# Patient Record
Sex: Male | Born: 1970 | Race: White | Hispanic: No | Marital: Married | Smoking: Former smoker
Health system: Southern US, Community
[De-identification: ages and names within clinical notes are randomized; demographics above are authoritative.]

## PROBLEM LIST (undated history)

## (undated) DIAGNOSIS — I10 Essential (primary) hypertension: Secondary | ICD-10-CM

## (undated) DIAGNOSIS — F419 Anxiety disorder, unspecified: Secondary | ICD-10-CM

## (undated) DIAGNOSIS — E7800 Pure hypercholesterolemia, unspecified: Secondary | ICD-10-CM

## (undated) HISTORY — PX: VASECTOMY: SHX75

## (undated) HISTORY — PX: CYSTECTOMY: SUR359

---

## 2007-04-23 ENCOUNTER — Emergency Department (HOSPITAL_COMMUNITY): Admission: EM | Admit: 2007-04-23 | Discharge: 2007-04-24 | Payer: Self-pay | Admitting: Emergency Medicine

## 2009-12-24 ENCOUNTER — Emergency Department (HOSPITAL_COMMUNITY): Admission: EM | Admit: 2009-12-24 | Discharge: 2009-12-24 | Payer: Self-pay | Admitting: Emergency Medicine

## 2011-02-08 LAB — CBC
HCT: 43 % (ref 39.0–52.0)
MCHC: 34.9 g/dL (ref 30.0–36.0)
MCV: 92.4 fL (ref 78.0–100.0)
Platelets: 338 10*3/uL (ref 150–400)
RDW: 12.6 % (ref 11.5–15.5)
WBC: 10.9 10*3/uL — ABNORMAL HIGH (ref 4.0–10.5)

## 2011-02-08 LAB — BASIC METABOLIC PANEL
BUN: 9 mg/dL (ref 6–23)
CO2: 26 mEq/L (ref 19–32)
Chloride: 105 mEq/L (ref 96–112)
Creatinine, Ser: 0.73 mg/dL (ref 0.4–1.5)
Glucose, Bld: 90 mg/dL (ref 70–99)
Potassium: 4.1 mEq/L (ref 3.5–5.1)

## 2011-02-08 LAB — DIFFERENTIAL
Basophils Relative: 1 % (ref 0–1)
Eosinophils Absolute: 0.2 10*3/uL (ref 0.0–0.7)
Eosinophils Relative: 2 % (ref 0–5)
Lymphs Abs: 2.3 10*3/uL (ref 0.7–4.0)
Neutrophils Relative %: 67 % (ref 43–77)

## 2016-12-13 DIAGNOSIS — Z23 Encounter for immunization: Secondary | ICD-10-CM | POA: Diagnosis not present

## 2016-12-13 DIAGNOSIS — H6590 Unspecified nonsuppurative otitis media, unspecified ear: Secondary | ICD-10-CM | POA: Diagnosis not present

## 2017-01-07 DIAGNOSIS — H6591 Unspecified nonsuppurative otitis media, right ear: Secondary | ICD-10-CM | POA: Diagnosis not present

## 2017-01-07 DIAGNOSIS — E785 Hyperlipidemia, unspecified: Secondary | ICD-10-CM | POA: Diagnosis not present

## 2017-01-07 DIAGNOSIS — I10 Essential (primary) hypertension: Secondary | ICD-10-CM | POA: Diagnosis not present

## 2017-01-07 DIAGNOSIS — F419 Anxiety disorder, unspecified: Secondary | ICD-10-CM | POA: Diagnosis not present

## 2017-01-09 DIAGNOSIS — I10 Essential (primary) hypertension: Secondary | ICD-10-CM | POA: Diagnosis not present

## 2017-01-09 DIAGNOSIS — E785 Hyperlipidemia, unspecified: Secondary | ICD-10-CM | POA: Diagnosis not present

## 2017-07-26 DIAGNOSIS — R1084 Generalized abdominal pain: Secondary | ICD-10-CM | POA: Insufficient documentation

## 2017-07-26 DIAGNOSIS — F17228 Nicotine dependence, chewing tobacco, with other nicotine-induced disorders: Secondary | ICD-10-CM | POA: Insufficient documentation

## 2017-07-26 DIAGNOSIS — R51 Headache: Secondary | ICD-10-CM | POA: Diagnosis present

## 2017-07-26 DIAGNOSIS — G4489 Other headache syndrome: Secondary | ICD-10-CM | POA: Insufficient documentation

## 2017-07-26 DIAGNOSIS — I10 Essential (primary) hypertension: Secondary | ICD-10-CM | POA: Diagnosis not present

## 2017-07-26 LAB — CBG MONITORING, ED: GLUCOSE-CAPILLARY: 152 mg/dL — AB (ref 65–99)

## 2017-07-26 NOTE — ED Notes (Signed)
Pt came to triage concerned that his problem may be his appendix. Triage RN notified.

## 2017-07-26 NOTE — ED Triage Notes (Signed)
Headache. Lower back pain and urinary frequency. He was seen by his MD for chills, body aches and back pain. He was given a one time antibiotic yesterday and he felt better this am.

## 2017-07-27 ENCOUNTER — Emergency Department (HOSPITAL_BASED_OUTPATIENT_CLINIC_OR_DEPARTMENT_OTHER)
Admission: EM | Admit: 2017-07-27 | Discharge: 2017-07-27 | Disposition: A | Discharge status: Home / Self Care | Payer: Commercial Managed Care - PPO | Attending: Emergency Medicine | Admitting: Emergency Medicine

## 2017-07-27 DIAGNOSIS — G4489 Other headache syndrome: Secondary | ICD-10-CM

## 2017-07-27 DIAGNOSIS — R1084 Generalized abdominal pain: Secondary | ICD-10-CM

## 2017-07-27 DIAGNOSIS — I10 Essential (primary) hypertension: Secondary | ICD-10-CM

## 2017-07-27 LAB — URINALYSIS, ROUTINE W REFLEX MICROSCOPIC
Bilirubin Urine: NEGATIVE
Glucose, UA: NEGATIVE mg/dL
Hgb urine dipstick: NEGATIVE
KETONES UR: NEGATIVE mg/dL
LEUKOCYTES UA: NEGATIVE
NITRITE: NEGATIVE
PROTEIN: NEGATIVE mg/dL
Specific Gravity, Urine: <1.005 — ABNORMAL LOW (ref 1.005–1.030)
pH: 6.5 (ref 5.0–8.0)

## 2017-07-27 LAB — BASIC METABOLIC PANEL
Anion gap: 12 (ref 5–15)
BUN: 7 mg/dL (ref 6–20)
CALCIUM: 8.8 mg/dL — AB (ref 8.9–10.3)
CO2: 23 mmol/L (ref 22–32)
Chloride: 93 mmol/L — ABNORMAL LOW (ref 101–111)
Creatinine, Ser: 0.79 mg/dL (ref 0.61–1.24)
GFR calc Af Amer: >60 mL/min (ref >60)
Glucose, Bld: 103 mg/dL — ABNORMAL HIGH (ref 65–99)
POTASSIUM: 2.9 mmol/L — AB (ref 3.5–5.1)
SODIUM: 128 mmol/L — AB (ref 135–145)

## 2017-07-27 LAB — CBC WITH DIFFERENTIAL/PLATELET
BASOS PCT: 0 %
Basophils Absolute: 0 10*3/uL (ref 0.0–0.1)
EOS ABS: 0.1 10*3/uL (ref 0.0–0.7)
EOS PCT: 1 %
HCT: 39.7 % (ref 39.0–52.0)
Hemoglobin: 14.4 g/dL (ref 13.0–17.0)
LYMPHS PCT: 30 %
Lymphs Abs: 3.2 10*3/uL (ref 0.7–4.0)
MCH: 33 pg (ref 26.0–34.0)
MCHC: 36.3 g/dL — ABNORMAL HIGH (ref 30.0–36.0)
MCV: 90.8 fL (ref 78.0–100.0)
Monocytes Absolute: 1 10*3/uL (ref 0.1–1.0)
Monocytes Relative: 9 %
Neutro Abs: 6.4 10*3/uL (ref 1.7–7.7)
Neutrophils Relative %: 60 %
PLATELETS: 303 10*3/uL (ref 150–400)
RBC: 4.37 MIL/uL (ref 4.22–5.81)
RDW: 11.2 % — ABNORMAL LOW (ref 11.5–15.5)
WBC: 10.8 10*3/uL — ABNORMAL HIGH (ref 4.0–10.5)

## 2017-07-27 LAB — HEPATIC FUNCTION PANEL
ALBUMIN: 3.7 g/dL (ref 3.5–5.0)
ALT: 48 U/L (ref 17–63)
AST: 56 U/L — AB (ref 15–41)
Alkaline Phosphatase: 70 U/L (ref 38–126)
Bilirubin, Direct: 0.3 mg/dL (ref 0.1–0.5)
Indirect Bilirubin: 0.7 mg/dL (ref 0.3–0.9)
Total Bilirubin: 1 mg/dL (ref 0.3–1.2)
Total Protein: 7.2 g/dL (ref 6.5–8.1)

## 2017-07-27 LAB — LIPASE, BLOOD: LIPASE: 30 U/L (ref 11–51)

## 2017-07-27 MED ORDER — SODIUM CHLORIDE 0.9 % IV BOLUS (SEPSIS)
1000.0000 mL | Freq: Once | INTRAVENOUS | Status: AC
  Administered 2017-07-27: 1000 mL via INTRAVENOUS

## 2017-07-27 MED ORDER — ONDANSETRON HCL 4 MG/2ML IJ SOLN
4.0000 mg | Freq: Once | INTRAMUSCULAR | Status: AC
  Administered 2017-07-27: 4 mg via INTRAVENOUS
  Filled 2017-07-27: qty 2

## 2017-07-27 MED ORDER — FENTANYL CITRATE (PF) 100 MCG/2ML IJ SOLN
50.0000 ug | Freq: Once | INTRAMUSCULAR | Status: AC
  Administered 2017-07-27: 50 ug via INTRAVENOUS
  Filled 2017-07-27: qty 2

## 2017-07-27 MED ORDER — BENAZEPRIL HCL 20 MG PO TABS: 20.0000 mg | ORAL_TABLET | Freq: Every day | ORAL | 0 refills | Status: DC

## 2017-07-27 NOTE — ED Provider Notes (Signed)
[facility] Provider Note   CSN: [id number] Arrival date & time: 07/26/17  2018     History   Chief Complaint Chief Complaint  Patient presents with  . Headache  . Abdominal Pain    HPI Edelmiro Innocent is a 46 y.o. male.  The history is provided by the patient and the spouse.  Abdominal Pain   This is a new problem. The current episode started 3 to 5 hours ago. The problem has been resolved. The pain is located in the generalized abdominal region. The pain is moderate. Associated symptoms include fever. Nothing aggravates the symptoms. Nothing relieves the symptoms.  pt presents for multiple complaints He reports "not feeling well" for several days He reports earlier this week he had onset of low back/right flank pain Seen by PCP, he had negative urinalysis but was placed on zpack Tonight the pain returned and he had some abdominal pain at dinner but this is improving No cp/sob/cough    PMH - HTN/HLP Soc hx - nonsmoker, smokeless tobacco only Home Medications    Prior to Admission medications   Not on File    Family History No family history on file.  Social History Social History  Substance Use Topics  . Smoking status: Not on file  . Smokeless tobacco: Not on file  . Alcohol use Not on file     Allergies   Patient has no allergy information on record.   Review of Systems Review of Systems  Constitutional: Positive for fever.  Gastrointestinal: Positive for abdominal pain.  All other systems reviewed and are negative.    Physical Exam Updated Vital Signs BP (!) 161/106 (BP Location: Right Arm)   Pulse 80   Temp 97.9 F (36.6 C) (Oral)   Resp 16   Ht 1.829 m (6')   Wt 113.4 kg (250 lb)   SpO2 96%   BMI 33.91 kg/m   Physical Exam  CONSTITUTIONAL: Well developed/well nourished HEAD: Normocephalic/atraumatic EYES: EOMI/PERRL ENMT: Mucous membranes moist NECK: supple no meningeal signs SPINE/BACK:entire spine nontender, mild right  paraspinal tenderness CV: S1/S2 noted, no murmurs/rubs/gallops noted LUNGS: Lungs are clear to auscultation bilaterally, no apparent distress ABDOMEN: soft, nontender, no rebound or guarding, bowel sounds noted throughout abdomen GU:no cva tenderness NEURO: Pt is awake/alert/appropriate, moves all extremitiesx4.  No facial droop.   EXTREMITIES: pulses normal/equal, full ROM SKIN: warm, color normal PSYCH: no abnormalities of mood noted, alert and oriented to situation  ED Treatments / Results  Labs (all labs ordered are listed, but only abnormal results are displayed) Labs Reviewed  BASIC METABOLIC PANEL - Abnormal; Notable for the following:       Result Value   Sodium 128 (*)    Potassium 2.9 (*)    Chloride 93 (*)    Glucose, Bld 103 (*)    Calcium 8.8 (*)    All other components within normal limits  CBC WITH DIFFERENTIAL/PLATELET - Abnormal; Notable for the following:    WBC 10.8 (*)    MCHC 36.3 (*)    RDW 11.2 (*)    All other components within normal limits  URINALYSIS, ROUTINE W REFLEX MICROSCOPIC - Abnormal; Notable for the following:    Specific Gravity, Urine <1.005 (*)    All other components within normal limits  HEPATIC FUNCTION PANEL - Abnormal; Notable for the following:    AST 56 (*)    All other components within normal limits  CBG MONITORING, ED - Abnormal; Notable for the following:  Glucose-Capillary 152 (*)    All other components within normal limits  LIPASE, BLOOD  CBG MONITORING, ED    EKG  EKG Interpretation None       Radiology No results found.  Procedures Procedures    Medications Ordered in ED Medications  sodium chloride 0.9 % bolus 1,000 mL (0 mLs Intravenous Stopped 07/27/17 0317)  fentaNYL (SUBLIMAZE) injection 50 mcg (50 mcg Intravenous Given 07/27/17 0150)  ondansetron (ZOFRAN) injection 4 mg (4 mg Intravenous Given 07/27/17 0150)     Initial Impression / Assessment and Plan / ED Course  I have reviewed the triage vital  signs and the nursing notes.  Pertinent labs  results that were available during my care of the patient were reviewed by me and considered in my medical decision making (see chart for details).     Pt improved He was in the ED for multiple complaints (HA/fatigue/abd pain/back pain) He felt improved with IV fluids He only had minimal paraspinal tenderness No focal ABD/RLQ tenderness No vomiting He reports HA improved He is dehydrated/hyponatremic Likely due to to diuretic in his meds He will stop this med and start lotensin only Stressed importance of PCP Followup If he has any return of abd pain in next 12-24 hrs he must return for CT imaging He feels comfortable going home Final Clinical Impressions(s) / ED Diagnoses   Final diagnoses:  Other headache syndrome  Essential hypertension  Generalized abdominal pain    New Prescriptions New Prescriptions   BENAZEPRIL (LOTENSIN) 20 MG TABLET    Take 1 tablet (20 mg total) by mouth daily.     Ripley Fraise, MD 07/27/17 [phone]

## 2017-07-27 NOTE — Discharge Instructions (Signed)

## 2017-07-30 ENCOUNTER — Other Ambulatory Visit (HOSPITAL_BASED_OUTPATIENT_CLINIC_OR_DEPARTMENT_OTHER): Payer: Self-pay | Admitting: Internal Medicine

## 2017-07-30 ENCOUNTER — Ambulatory Visit (HOSPITAL_BASED_OUTPATIENT_CLINIC_OR_DEPARTMENT_OTHER)
Admission: RE | Admit: 2017-07-30 | Discharge: 2017-07-30 | Disposition: A | Discharge status: Home / Self Care | Payer: Commercial Managed Care - PPO | Source: Ambulatory Visit | Attending: Internal Medicine | Admitting: Internal Medicine

## 2017-07-30 DIAGNOSIS — R109 Unspecified abdominal pain: Secondary | ICD-10-CM | POA: Insufficient documentation

## 2017-07-30 DIAGNOSIS — I700 Atherosclerosis of aorta: Secondary | ICD-10-CM | POA: Diagnosis not present

## 2017-07-30 MED ORDER — IOPAMIDOL (ISOVUE-300) INJECTION 61%
100.0000 mL | Freq: Once | INTRAVENOUS | Status: AC | PRN
  Administered 2017-07-30: 100 mL via INTRAVENOUS

## 2017-11-14 ENCOUNTER — Other Ambulatory Visit: Payer: Self-pay

## 2017-11-14 ENCOUNTER — Emergency Department (HOSPITAL_COMMUNITY): Payer: Commercial Managed Care - PPO

## 2017-11-14 ENCOUNTER — Encounter (HOSPITAL_COMMUNITY): Payer: Self-pay

## 2017-11-14 ENCOUNTER — Emergency Department (HOSPITAL_COMMUNITY)
Admission: EM | Admit: 2017-11-14 | Discharge: 2017-11-14 | Disposition: A | Discharge status: Home / Self Care | Payer: Commercial Managed Care - PPO | Attending: Emergency Medicine | Admitting: Emergency Medicine

## 2017-11-14 DIAGNOSIS — Z79899 Other long term (current) drug therapy: Secondary | ICD-10-CM | POA: Diagnosis not present

## 2017-11-14 DIAGNOSIS — B029 Zoster without complications: Secondary | ICD-10-CM

## 2017-11-14 DIAGNOSIS — R079 Chest pain, unspecified: Secondary | ICD-10-CM | POA: Diagnosis present

## 2017-11-14 DIAGNOSIS — F419 Anxiety disorder, unspecified: Secondary | ICD-10-CM | POA: Insufficient documentation

## 2017-11-14 DIAGNOSIS — Z87891 Personal history of nicotine dependence: Secondary | ICD-10-CM | POA: Insufficient documentation

## 2017-11-14 DIAGNOSIS — I10 Essential (primary) hypertension: Secondary | ICD-10-CM | POA: Insufficient documentation

## 2017-11-14 DIAGNOSIS — R0789 Other chest pain: Secondary | ICD-10-CM | POA: Insufficient documentation

## 2017-11-14 HISTORY — DX: Pure hypercholesterolemia, unspecified: E78.00

## 2017-11-14 HISTORY — DX: Anxiety disorder, unspecified: F41.9

## 2017-11-14 HISTORY — DX: Essential (primary) hypertension: I10

## 2017-11-14 LAB — BASIC METABOLIC PANEL
Anion gap: 11 (ref 5–15)
BUN: 14 mg/dL (ref 6–20)
CALCIUM: 9.6 mg/dL (ref 8.9–10.3)
CHLORIDE: 98 mmol/L — AB (ref 101–111)
CO2: 25 mmol/L (ref 22–32)
CREATININE: 0.85 mg/dL (ref 0.61–1.24)
GFR calc non Af Amer: >60 mL/min (ref >60)
Glucose, Bld: 110 mg/dL — ABNORMAL HIGH (ref 65–99)
Potassium: 3.8 mmol/L (ref 3.5–5.1)
SODIUM: 134 mmol/L — AB (ref 135–145)

## 2017-11-14 LAB — CBC
HCT: 43.8 % (ref 39.0–52.0)
Hemoglobin: 15.3 g/dL (ref 13.0–17.0)
MCH: 32.6 pg (ref 26.0–34.0)
MCHC: 34.9 g/dL (ref 30.0–36.0)
MCV: 93.4 fL (ref 78.0–100.0)
PLATELETS: 321 10*3/uL (ref 150–400)
RBC: 4.69 MIL/uL (ref 4.22–5.81)
RDW: 11.8 % (ref 11.5–15.5)
WBC: 11.8 10*3/uL — ABNORMAL HIGH (ref 4.0–10.5)

## 2017-11-14 LAB — I-STAT TROPONIN, ED
TROPONIN I, POC: 0 ng/mL (ref 0.00–0.08)
Troponin i, poc: 0 ng/mL (ref 0.00–0.08)

## 2017-11-14 MED ORDER — SODIUM CHLORIDE 0.9 % IV BOLUS (SEPSIS)
1000.0000 mL | Freq: Once | INTRAVENOUS | Status: AC
  Administered 2017-11-14: 1000 mL via INTRAVENOUS

## 2017-11-14 MED ORDER — ASPIRIN 81 MG PO CHEW
324.0000 mg | CHEWABLE_TABLET | Freq: Once | ORAL | Status: AC
  Administered 2017-11-14: 324 mg via ORAL
  Filled 2017-11-14: qty 4

## 2017-11-14 MED ORDER — NITROGLYCERIN 0.4 MG SL SUBL
0.4000 mg | SUBLINGUAL_TABLET | SUBLINGUAL | Status: DC | PRN
  Filled 2017-11-14: qty 1

## 2017-11-14 NOTE — ED Triage Notes (Addendum)
Patient c/o mid chest tightness that started at 1500. Patient denies any SOB, diaphoresis, N/V.  Patient added that he had red raised rash above the right knee. Describes pain as burning.

## 2017-11-14 NOTE — ED Provider Notes (Signed)
[facility] DEPT Provider Note   CSN: [id number] Arrival date & time: 11/14/17  1626     History   Chief Complaint Chief Complaint  Patient presents with  . Chest Pain  . Rash    HPI Danny Richard is a 46 y.o. male.  HPI  46 year old male with a history of hypertension, borderline hypercholesterolemia presents with chest pain.  Started around 3 PM when he was driving home from work.  He states his been very stressed over the last 1 week.  He denies any radiation of the pain, shortness of breath, nausea or vomiting.  He states he frequently has a history of diaphoresis but has not had any today.  He has been having continuous chest pain since it started.  Feels like a pressure and tightness.  Nothing seems to make it better or worse including no exertional symptoms.  He states when he got home he checked his blood pressure and it was 160/100.  Since then the pain has improved.  No pleuritic symptoms.  No recent significant travel or surgery.  No calf swelling or pain.  He states that he used to smoke but now has smokeless tobacco.  His grandfather had a heart attack at age 44 but no other significant past cardiac medical history in the family.  He also has had a rash above his right knee for 4 days.  He is concerned about shingles.  He states the rash has stayed the same since onset.  Occasionally has a tingling and burning type pain.  Past Medical History:  Diagnosis Date  . Anxiety   . High cholesterol   . Hypertension     There are no active problems to display for this patient.   Past Surgical History:  Procedure Laterality Date  . CYSTECTOMY    . VASECTOMY         Home Medications    Prior to Admission medications   Medication Sig Start Date End Date Taking? Authorizing Provider  atorvastatin (LIPITOR) 20 MG tablet Take 20 mg by mouth daily.   Yes [provider]  benazepril-hydrochlorthiazide (LOTENSIN HCT) 20-25 MG tablet Take  1 tablet by mouth daily.   Yes [provider]  buPROPion (WELLBUTRIN) 100 MG tablet Take 100 mg by mouth 2 (two) times daily.   Yes [provider]  co-enzyme Q-10 30 MG capsule Take 30 mg by mouth daily.   Yes [provider]  metoprolol succinate (TOPROL-XL) 50 MG 24 hr tablet Take 50 mg by mouth daily. Take with or immediately following a meal.   Yes [provider]  Multiple Vitamins-Minerals (MULTIVITAMIN ADULTS PO) Take 1 tablet by mouth daily.   Yes [provider]  omeprazole (PRILOSEC) 20 MG capsule Take 20 mg by mouth daily.   Yes [provider]  benazepril (LOTENSIN) 20 MG tablet Take 1 tablet (20 mg total) by mouth daily. Patient not taking: Reported on 11/14/2017 07/27/17   Ripley Fraise, MD    Family History Family History  Problem Relation Age of Onset  . Diabetes Mother   . High Cholesterol Mother   . COPD Father   . Heart failure Father     Social History Social History   Tobacco Use  . Smoking status: Former Research scientist (life sciences)  . Smokeless tobacco: Current User    Types: Snuff  Substance Use Topics  . Alcohol use: Yes    Comment: daily  . Drug use: No     Allergies  Patient has no known allergies.   Review of Systems Review of Systems  Constitutional: Negative for diaphoresis and fever.  Respiratory: Negative for shortness of breath.   Cardiovascular: Positive for chest pain.  Gastrointestinal: Negative for nausea and vomiting.  Musculoskeletal: Negative for back pain.  Skin: Positive for rash.  All other systems reviewed and are negative.    Physical Exam Updated Vital Signs BP (!) 125/97   Pulse 65   Temp 98.2 F (36.8 C) (Oral)   Resp 11   Ht 6' (1.829 m)   Wt 113.4 kg (250 lb)   SpO2 97%   BMI 33.91 kg/m   Physical Exam  Constitutional: He is oriented to person, place, and time. He appears well-developed and well-nourished.  Non-toxic appearance. He does not appear ill. No distress.    HENT:  Head: Normocephalic and atraumatic.  Right Ear: External ear normal.  Left Ear: External ear normal.  Nose: Nose normal.  Eyes: Right eye exhibits no discharge. Left eye exhibits no discharge.  Neck: Neck supple.  Cardiovascular: Normal rate, regular rhythm and normal heart sounds.  Pulmonary/Chest: Effort normal and breath sounds normal. He exhibits no tenderness.  Abdominal: Soft. There is no tenderness.  Musculoskeletal: He exhibits no edema.  Neurological: He is alert and oriented to person, place, and time.  Skin: Skin is warm and dry. Rash noted.  Mild clusters of vesicles superior to the right knee on the anterior aspect.  Minimally raised but not tender.  Nursing note and vitals reviewed.      ED Treatments / Results  Labs (all labs ordered are listed, but only abnormal results are displayed) Labs Reviewed  BASIC METABOLIC PANEL - Abnormal; Notable for the following components:      Result Value   Sodium 134 (*)    Chloride 98 (*)    Glucose, Bld 110 (*)    All other components within normal limits  CBC - Abnormal; Notable for the following components:   WBC 11.8 (*)    All other components within normal limits  I-STAT TROPONIN, ED  I-STAT TROPONIN, ED    EKG  EKG Interpretation  Date/Time:  Thursday November 14 2017 16:41:08 EST Ventricular Rate:  96 PR Interval:    QRS Duration: 102 QT Interval:  339 QTC Calculation: 429 R Axis:   -73 Text Interpretation:  Sinus rhythm Consider left atrial enlargement LAD, consider left anterior fascicular block Low voltage, precordial leads Borderline T abnormalities, inferior leads Baseline wander in lead(s) I II aVR aVF No old tracing to compare Confirmed by Sherwood Gambler [phone]) on 11/14/2017 8:23:22 PM       EKG Interpretation  Date/Time:  Thursday November 14 2017 21:17:42 EST Ventricular Rate:  64 PR Interval:    QRS Duration: 112 QT Interval:  416 QTC Calculation: 430 R Axis:   -16 Text  Interpretation:  Sinus rhythm Borderline intraventricular conduction delay Borderline T abnormalities, inferior leads no significant change since earlier in the day Confirmed by Sherwood Gambler [phone]) on 11/14/2017 9:26:29 PM        Radiology Dg Chest 2 View  Result Date: 11/14/2017 CLINICAL DATA:  Chest pain EXAM: CHEST  2 VIEW COMPARISON:  None. FINDINGS: There is no edema or consolidation. The heart size and pulmonary vascularity are normal. No adenopathy. No pneumothorax. No bone lesions. IMPRESSION: No edema or consolidation. Electronically Signed   By: Lowella Grip III M.D.   On: 11/14/2017 18:04    Procedures Procedures (including critical care time)  Medications Ordered in ED Medications  nitroGLYCERIN (NITROSTAT) SL tablet 0.4 mg (0 mg Sublingual Hold 11/14/17 2110)  aspirin chewable tablet 324 mg (324 mg Oral Given 11/14/17 2108)  sodium chloride 0.9 % bolus 1,000 mL (0 mLs Intravenous Stopped 11/14/17 2157)     Initial Impression / Assessment and Plan / ED Course  I have reviewed the triage vital signs and the nursing notes.  Pertinent labs & imaging results that were available during my care of the patient were reviewed by me and considered in my medical decision making (see chart for details).     Patient's chest pain is atypical but he does have multiple risk factors including being overweight, hypertension, hyperlipidemia.  He has T wave inversions in 3 and aVF but no old to compare to.  No ST elevation.  Has had repeat EKG that shows no significant change.  Unclear if this is baseline for him.  I discussed plan with patient and discussed that his HEART score is a 4, which puts him at moderate risk.  I discussed that we typically would admit these for observation and close cardiac workup.  However he declines.  He understands potential for major cardiac event or death is in the 12-16% range over the next 30 days.  He is advised to follow-up very closely with his  PCP.  A second troponin was performed which was negative.  My suspicion for PE is quite low and I do not think testing is indicated.  As for his rash, this does appear to be shingles.  He is over 72 hours since onset.  I offered him antivirals but he declines, was wanting to know a diagnosis.  I discussed potential groups of patients/contacts that should not be around him including pregnant patients, immunocompromise, or very young or very old.  He understands this.  He understands strict return precautions.  Final Clinical Impressions(s) / ED Diagnoses   Final diagnoses:  Atypical chest pain  Herpes zoster without complication    ED Discharge Orders    None       Sherwood Gambler, MD 11/14/17 2329

## 2018-08-22 DIAGNOSIS — F419 Anxiety disorder, unspecified: Secondary | ICD-10-CM | POA: Diagnosis not present

## 2018-08-22 DIAGNOSIS — F439 Reaction to severe stress, unspecified: Secondary | ICD-10-CM | POA: Diagnosis not present

## 2018-12-01 DIAGNOSIS — L235 Allergic contact dermatitis due to other chemical products: Secondary | ICD-10-CM | POA: Diagnosis not present

## 2018-12-01 DIAGNOSIS — H11423 Conjunctival edema, bilateral: Secondary | ICD-10-CM | POA: Diagnosis not present

## 2018-12-01 DIAGNOSIS — H16143 Punctate keratitis, bilateral: Secondary | ICD-10-CM | POA: Diagnosis not present

## 2018-12-24 DIAGNOSIS — R21 Rash and other nonspecific skin eruption: Secondary | ICD-10-CM | POA: Diagnosis not present

## 2018-12-24 DIAGNOSIS — I10 Essential (primary) hypertension: Secondary | ICD-10-CM | POA: Diagnosis not present

## 2019-01-21 DIAGNOSIS — I10 Essential (primary) hypertension: Secondary | ICD-10-CM | POA: Diagnosis not present

## 2019-01-21 DIAGNOSIS — F419 Anxiety disorder, unspecified: Secondary | ICD-10-CM | POA: Diagnosis not present

## 2019-01-21 DIAGNOSIS — F410 Panic disorder [episodic paroxysmal anxiety] without agoraphobia: Secondary | ICD-10-CM | POA: Diagnosis not present

## 2019-01-21 DIAGNOSIS — E785 Hyperlipidemia, unspecified: Secondary | ICD-10-CM | POA: Diagnosis not present

## 2020-03-10 ENCOUNTER — Other Ambulatory Visit: Payer: Self-pay

## 2020-03-10 ENCOUNTER — Ambulatory Visit (INDEPENDENT_AMBULATORY_CARE_PROVIDER_SITE_OTHER): Payer: No Typology Code available for payment source | Admitting: Physician Assistant

## 2020-03-10 ENCOUNTER — Encounter: Payer: Self-pay | Admitting: Physician Assistant

## 2020-03-10 DIAGNOSIS — D485 Neoplasm of uncertain behavior of skin: Secondary | ICD-10-CM

## 2020-03-10 DIAGNOSIS — L570 Actinic keratosis: Secondary | ICD-10-CM | POA: Diagnosis not present

## 2020-03-10 DIAGNOSIS — L300 Nummular dermatitis: Secondary | ICD-10-CM

## 2020-03-10 DIAGNOSIS — Z1283 Encounter for screening for malignant neoplasm of skin: Secondary | ICD-10-CM | POA: Diagnosis not present

## 2020-03-10 MED ORDER — BETAMETHASONE DIPROPIONATE 0.05 % EX CREA: TOPICAL_CREAM | Freq: Two times a day (BID) | CUTANEOUS | 11 refills | Status: AC | PRN

## 2020-03-10 NOTE — Patient Instructions (Addendum)

## 2020-03-10 NOTE — Progress Notes (Signed)
   New Patient Visit  Subjective  Danny Richard is a 49 y.o. male who presents for the following: New Patient (Initial Visit) (Here today for a general skin check. Think his mom has a history of NMSC.  He has not had anything removed. Does have a couple of places with itching and flaking.  Have bleed before because of him itching them.). 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 Scaling circular spot on outside of right knee that has been there for 1 month. Showed up and was flaking and itched a bit. He has not treated this one. Last year he had one like it on the right thigh that cleared on its own. He has other areas that itch on the lower legs. He also had a rough spot on his scalp that had been present 1 month. Dermatologist friend told him it was precancer but it is gone today.      Objective  Well appearing patient in no apparent distress; mood and affect are within normal limits.  A full examination was performed including head, eyes, ears, nose, lips, neck, chest, axillae, abdomen, back, buttocks, bilateral upper extremities, bilateral lower extremities, hands, feet, fingers, toes, fingernails, and toenails. All findings within normal limits unless otherwise noted below. No suspicious moles noted on back.   Objective  Left Forearm - Posterior, Left Superior Helix: Erythematous patches with gritty scale.  Objective  Left Upper Chest: Brown macule with irregular shape and color  1.6 cm to small angioma       Objective  Left Lower Leg - Anterior, Left Lower Leg - Posterior, Left Thigh - Anterior, Right Lower Leg - Anterior, Right Lower Leg - Posterior, Right Thigh - Anterior: Thin scaly erythematous papules coalescing to plaques.   Venous lake noted right lower lip. Blanches with pressure.  Assessment & Plan  AK (actinic keratosis) (2) Left Forearm - Posterior; Left Superior Helix  Destruction of lesion - Left Forearm - Posterior, Left Superior  Helix Complexity: simple   Destruction method: cryotherapy   Informed consent: discussed and consent obtained   Timeout:  patient name, date of birth, surgical site, and procedure verified Lesion destroyed using liquid nitrogen: Yes   Outcome: patient tolerated procedure well with no complications    Neoplasm of uncertain behavior of skin Left Upper Chest  Skin / nail biopsy Type of biopsy: tangential   Procedure prep:  Patient was prepped and draped in usual sterile fashion (Non sterile) Prep type:  Chlorhexidine Anesthesia: the lesion was anesthetized in a standard fashion   Anesthetic:  1% lidocaine w/ epinephrine 1-100,000 local infiltration Instrument used: flexible razor blade    Specimen 1 - Surgical pathology Differential Diagnosis: R/O BCC vs SCC Check Margins: No  Nummular eczema (6) Left Thigh - Anterior; Right Thigh - Anterior; Left Lower Leg - Anterior; Right Lower Leg - Anterior; Left Lower Leg - Posterior; Right Lower Leg - Posterior  betamethasone dipropionate 0.05 % cream - Left Lower Leg - Anterior, Left Lower Leg - Posterior, Left Thigh - Anterior, Right Lower Leg - Anterior, Right Lower Leg - Posterior, Right Thigh - Anterior  For possible AKs on scalp none were noted today but we discussed the option to use 5FU cream in the fall on the scalp. We will visit in October for this.

## 2020-03-14 ENCOUNTER — Telehealth: Payer: Self-pay

## 2020-03-14 NOTE — Telephone Encounter (Signed)
Phone call to patient with his Pathology results.  Patient aware of pathology results, patient has an appointment already scheduled in October.

## 2020-03-14 NOTE — Telephone Encounter (Signed)
-----   Message from Arlyss Gandy, [place] sent at 03/14/2020  8:18 AM EDT ----- Moderate chest- recheck 6 months

## 2020-05-04 ENCOUNTER — Other Ambulatory Visit: Payer: Self-pay

## 2020-05-04 ENCOUNTER — Emergency Department (HOSPITAL_COMMUNITY)
Admission: EM | Admit: 2020-05-04 | Discharge: 2020-05-04 | Disposition: A | Discharge status: Home / Self Care | Payer: PRIVATE HEALTH INSURANCE | Attending: Emergency Medicine | Admitting: Emergency Medicine

## 2020-05-04 DIAGNOSIS — K611 Rectal abscess: Secondary | ICD-10-CM | POA: Diagnosis not present

## 2020-05-04 DIAGNOSIS — I10 Essential (primary) hypertension: Secondary | ICD-10-CM | POA: Insufficient documentation

## 2020-05-04 DIAGNOSIS — Z87891 Personal history of nicotine dependence: Secondary | ICD-10-CM | POA: Insufficient documentation

## 2020-05-04 DIAGNOSIS — Z79899 Other long term (current) drug therapy: Secondary | ICD-10-CM | POA: Insufficient documentation

## 2020-05-04 DIAGNOSIS — R197 Diarrhea, unspecified: Secondary | ICD-10-CM | POA: Diagnosis present

## 2020-05-04 DIAGNOSIS — K6289 Other specified diseases of anus and rectum: Secondary | ICD-10-CM | POA: Insufficient documentation

## 2020-05-04 MED ORDER — AMOXICILLIN-POT CLAVULANATE 875-125 MG PO TABS: 1.0000 | ORAL_TABLET | Freq: Two times a day (BID) | ORAL | 0 refills | Status: AC

## 2020-05-04 NOTE — ED Triage Notes (Signed)
July 15th has an appointment with Urologist for mass on testicle

## 2020-05-04 NOTE — Discharge Instructions (Signed)
I am prescribing you an antibiotic called Augmentin.  Please take this as prescribed and please complete the full course of antibiotics.  I would recommend taking Tylenol and ibuprofen as needed for management of your pain.  Please follow the instructions on the bottles.  Please return to the emergency department with any new or worsening symptoms.  Please follow-up with your primary care provider next week if your diarrhea symptoms have not improved.  It was a pleasure to meet you.

## 2020-05-04 NOTE — ED Provider Notes (Signed)
[facility] DEPT Provider Note   CSN: [id number] Arrival date & time: 05/04/20  4970     History Chief Complaint  Patient presents with  . Rectal Abcess  . Diarrhea    Danny Richard is a 49 y.o. male.  HPI Patient is a 49 year old male who presents with a rectal abscess.  Patient states about 5 days ago he began experiencing generalized malaise.  About 3 days ago he began experiencing pain and swelling in the rectal region.  2 days ago he notes that the region ruptured and expressed a significant amount of purulent discharge.  Since then he has continued to feel some gradually alleviating malaise but denies any significant pain in the region.  He "just wants to get it checked out".  He states that [place] and has been experiencing gradually alleviating diarrhea since then.  He has been taking Imodium as needed with short-term relief.  He denies fevers, chills, chest pain, shortness of breath, abdominal pain, nausea, vomiting, constipation, dysuria, syncope.     Past Medical History:  Diagnosis Date  . Anxiety   . High cholesterol   . Hypertension     There are no problems to display for this patient.   Past Surgical History:  Procedure Laterality Date  . CYSTECTOMY    . VASECTOMY         Family History  Problem Relation Age of Onset  . Diabetes Mother   . High Cholesterol Mother   . COPD Father   . Heart failure Father     Social History   Tobacco Use  . Smoking status: Former Research scientist (life sciences)  . Smokeless tobacco: Current User    Types: Snuff  Vaping Use  . Vaping Use: Never used  Substance Use Topics  . Alcohol use: Yes    Comment: daily  . Drug use: No    Home Medications Prior to Admission medications   Medication Sig Start Date End Date Taking? Authorizing Provider  atorvastatin (LIPITOR) 20 MG tablet Take 20 mg by mouth daily.    [provider]  benazepril-hydrochlorthiazide  (LOTENSIN HCT) 20-25 MG tablet Take 1 tablet by mouth daily.    [provider]  betamethasone dipropionate 0.05 % cream Apply topically 2 (two) times daily as needed (Rash). 03/10/20   Clark-Bruning, Anderson Malta, PA-C  buPROPion (WELLBUTRIN) 100 MG tablet Take 100 mg by mouth 2 (two) times daily.    [provider]  fluticasone (FLONASE) 50 MCG/ACT nasal spray Place into both nostrils daily.    [provider]  metoprolol succinate (TOPROL-XL) 50 MG 24 hr tablet Take 50 mg by mouth daily. Take with or immediately following a meal.    [provider]  omeprazole (PRILOSEC) 20 MG capsule Take 20 mg by mouth daily.    [provider]    Allergies    Patient has no known allergies.  Review of Systems   Review of Systems  All other systems reviewed and are negative. Ten systems reviewed and are negative for acute change, except as noted in the HPI.    Physical Exam Updated Vital Signs BP (!) 133/97 (BP Location: Left Arm)   Pulse 81   Temp 97.8 F (36.6 C) (Oral)   Ht 6' (1.829 m)   Wt 122.5 kg   SpO2 94%   BMI 36.62 kg/m   Physical Exam Vitals and nursing note reviewed.  Constitutional:      General: He is  not in acute distress.    Appearance: Normal appearance. He is not ill-appearing, toxic-appearing or diaphoretic.  HENT:     Head: Normocephalic and atraumatic.     Right Ear: External ear normal.     Left Ear: External ear normal.     Nose: Nose normal.     Mouth/Throat:     Mouth: Mucous membranes are moist.     Pharynx: Oropharynx is clear. No oropharyngeal exudate or posterior oropharyngeal erythema.  Eyes:     Extraocular Movements: Extraocular movements intact.  Cardiovascular:     Rate and Rhythm: Normal rate and regular rhythm.     Pulses: Normal pulses.     Heart sounds: Normal heart sounds. No murmur heard.  No friction rub. No gallop.   Pulmonary:     Effort: Pulmonary effort is normal. No respiratory distress.      Breath sounds: Normal breath sounds. No stridor. No wheezing, rhonchi or rales.  Abdominal:     General: Abdomen is flat.     Tenderness: There is no abdominal tenderness.  Genitourinary:    Comments: Normal appearing anal region. No visible external hemorrhoids. Small non tender fissure noted at the 12 o'clock position.  No significant tenderness appreciated in the perirectal region.  No palpable fluctuance.  No visible erythema. Musculoskeletal:        General: Normal range of motion.     Cervical back: Normal range of motion and neck supple. No tenderness.  Skin:    General: Skin is warm and dry.  Neurological:     General: No focal deficit present.     Mental Status: He is alert and oriented to person, place, and time.  Psychiatric:        Mood and Affect: Mood normal.        Behavior: Behavior normal.    ED Results / Procedures / Treatments   Labs (all labs ordered are listed, but only abnormal results are displayed) Labs Reviewed - No data to display  EKG None  Radiology No results found.  Procedures Procedures (including critical care time)  Medications Ordered in ED Medications - No data to display  ED Course  I have reviewed the triage vital signs and the nursing notes.  Pertinent labs & imaging results that were available during my care of the patient were reviewed by me and considered in my medical decision making (see chart for details).    MDM Rules/Calculators/A&P                          Patient is a 49 year old male with a history of perirectal abscess that presents due to a mostly alleviated perirectal abscess.  He states the abscess ruptured 2 days ago and he denies any significant discharge since then.  Physical exam is reassuring.  No visible abscess in the region or palpable pain in the region.  No cellulitis.  Afebrile.  Not tachycardic.  Patient does note that he has been experiencing general malaise for the last 4 to 5 days.  He does have a  history of perirectal abscess that was drained in the OR in the past and required IV abx.   Due to his general symptoms and medical history I will discharge patient on Augmentin.  He was given very strict return precautions and understands that he needs to return to the emergency department with any new or worsening sx.  We discussed the brat diet for his diarrhea.  Continued use of Imodium as needed.  I recommended that he follow-up with his PCP next week if he finds that his diarrhea is not alleviated.  His questions were answered and he was amicable to time of discharge.  His vital signs are stable.  Patient discharged to home/self care.  Condition at discharge: Stable  Note: Portions of this report may have been transcribed using voice recognition software. Every effort was made to ensure accuracy; however, inadvertent computerized transcription errors may be present.    Final Clinical Impression(s) / ED Diagnoses Final diagnoses:  Diarrhea, unspecified type  Rectal pain    Rx / DC Orders ED Discharge Orders         Ordered    amoxicillin-clavulanate (AUGMENTIN) 875-125 MG tablet  Every 12 hours     Discontinue  Reprint     05/04/20 1048           Rayna Sexton, PA-C 05/04/20 1052    Margette Fast, MD 05/05/20 [phone]

## 2020-05-04 NOTE — ED Triage Notes (Signed)
Rectal abscess burst on Monday wants to get it checked out. Was in [place] last week and has had diarrhea since then.

## 2020-09-12 ENCOUNTER — Ambulatory Visit: Payer: No Typology Code available for payment source | Admitting: Physician Assistant

## 2021-01-11 ENCOUNTER — Emergency Department (HOSPITAL_BASED_OUTPATIENT_CLINIC_OR_DEPARTMENT_OTHER): Payer: No Typology Code available for payment source

## 2021-01-11 ENCOUNTER — Inpatient Hospital Stay (HOSPITAL_BASED_OUTPATIENT_CLINIC_OR_DEPARTMENT_OTHER)
Admission: EM | Admit: 2021-01-11 | Discharge: 2021-01-17 | DRG: 641 | Disposition: A | Discharge status: Home / Self Care | Payer: No Typology Code available for payment source | Attending: Internal Medicine | Admitting: Internal Medicine

## 2021-01-11 ENCOUNTER — Encounter (HOSPITAL_BASED_OUTPATIENT_CLINIC_OR_DEPARTMENT_OTHER): Payer: Self-pay

## 2021-01-11 ENCOUNTER — Other Ambulatory Visit: Payer: Self-pay

## 2021-01-11 DIAGNOSIS — E871 Hypo-osmolality and hyponatremia: Secondary | ICD-10-CM | POA: Diagnosis not present

## 2021-01-11 DIAGNOSIS — R7989 Other specified abnormal findings of blood chemistry: Secondary | ICD-10-CM | POA: Diagnosis not present

## 2021-01-11 DIAGNOSIS — Z87891 Personal history of nicotine dependence: Secondary | ICD-10-CM

## 2021-01-11 DIAGNOSIS — E876 Hypokalemia: Secondary | ICD-10-CM | POA: Diagnosis not present

## 2021-01-11 DIAGNOSIS — Z79899 Other long term (current) drug therapy: Secondary | ICD-10-CM

## 2021-01-11 DIAGNOSIS — Z8616 Personal history of COVID-19: Secondary | ICD-10-CM | POA: Diagnosis not present

## 2021-01-11 DIAGNOSIS — F1010 Alcohol abuse, uncomplicated: Secondary | ICD-10-CM | POA: Diagnosis not present

## 2021-01-11 DIAGNOSIS — I10 Essential (primary) hypertension: Secondary | ICD-10-CM | POA: Diagnosis present

## 2021-01-11 DIAGNOSIS — E7800 Pure hypercholesterolemia, unspecified: Secondary | ICD-10-CM | POA: Diagnosis present

## 2021-01-11 DIAGNOSIS — K760 Fatty (change of) liver, not elsewhere classified: Secondary | ICD-10-CM | POA: Diagnosis present

## 2021-01-11 DIAGNOSIS — E785 Hyperlipidemia, unspecified: Secondary | ICD-10-CM | POA: Diagnosis present

## 2021-01-11 DIAGNOSIS — F419 Anxiety disorder, unspecified: Secondary | ICD-10-CM | POA: Diagnosis present

## 2021-01-11 DIAGNOSIS — R945 Abnormal results of liver function studies: Secondary | ICD-10-CM

## 2021-01-11 DIAGNOSIS — F10239 Alcohol dependence with withdrawal, unspecified: Secondary | ICD-10-CM

## 2021-01-11 DIAGNOSIS — U071 COVID-19: Secondary | ICD-10-CM

## 2021-01-11 DIAGNOSIS — F32A Depression, unspecified: Secondary | ICD-10-CM | POA: Diagnosis present

## 2021-01-11 DIAGNOSIS — F10230 Alcohol dependence with withdrawal, uncomplicated: Secondary | ICD-10-CM | POA: Diagnosis not present

## 2021-01-11 DIAGNOSIS — T464X5A Adverse effect of angiotensin-converting-enzyme inhibitors, initial encounter: Secondary | ICD-10-CM | POA: Diagnosis present

## 2021-01-11 DIAGNOSIS — F10939 Alcohol use, unspecified with withdrawal, unspecified: Secondary | ICD-10-CM

## 2021-01-11 LAB — COMPREHENSIVE METABOLIC PANEL
ALT: 123 U/L — ABNORMAL HIGH (ref 0–44)
ALT: 114 U/L — ABNORMAL HIGH (ref 0–44)
AST: 211 U/L — ABNORMAL HIGH (ref 15–41)
AST: 184 U/L — ABNORMAL HIGH (ref 15–41)
Albumin: 4 g/dL (ref 3.5–5.0)
Albumin: 3.8 g/dL (ref 3.5–5.0)
Alkaline Phosphatase: 84 U/L (ref 38–126)
Alkaline Phosphatase: 84 U/L (ref 38–126)
Anion gap: 14 (ref 5–15)
Anion gap: 14 (ref 5–15)
BUN: <5 mg/dL — ABNORMAL LOW (ref 6–20)
BUN: <5 mg/dL — ABNORMAL LOW (ref 6–20)
CO2: 26 mmol/L (ref 22–32)
CO2: 26 mmol/L (ref 22–32)
Calcium: 9 mg/dL (ref 8.9–10.3)
Calcium: 8.9 mg/dL (ref 8.9–10.3)
Chloride: 75 mmol/L — ABNORMAL LOW (ref 98–111)
Chloride: 80 mmol/L — ABNORMAL LOW (ref 98–111)
Creatinine, Ser: 0.41 mg/dL — ABNORMAL LOW (ref 0.61–1.24)
Creatinine, Ser: 0.38 mg/dL — ABNORMAL LOW (ref 0.61–1.24)
GFR, Estimated: >60 mL/min (ref >60)
GFR, Estimated: >60 mL/min (ref >60)
Glucose, Bld: 149 mg/dL — ABNORMAL HIGH (ref 70–99)
Glucose, Bld: 154 mg/dL — ABNORMAL HIGH (ref 70–99)
Potassium: 2.8 mmol/L — ABNORMAL LOW (ref 3.5–5.1)
Potassium: 3.2 mmol/L — ABNORMAL LOW (ref 3.5–5.1)
Sodium: 115 mmol/L — CL (ref 135–145)
Sodium: 120 mmol/L — ABNORMAL LOW (ref 135–145)
Total Bilirubin: 3.5 mg/dL — ABNORMAL HIGH (ref 0.3–1.2)
Total Bilirubin: 3.6 mg/dL — ABNORMAL HIGH (ref 0.3–1.2)
Total Protein: 7.6 g/dL (ref 6.5–8.1)
Total Protein: 7.6 g/dL (ref 6.5–8.1)

## 2021-01-11 LAB — CBC WITH DIFFERENTIAL/PLATELET
Abs Immature Granulocytes: 0.04 10*3/uL (ref 0.00–0.07)
Basophils Absolute: 0 10*3/uL (ref 0.0–0.1)
Basophils Relative: 0 %
Eosinophils Absolute: 0 10*3/uL (ref 0.0–0.5)
Eosinophils Relative: 0 %
Hemoglobin: 13.9 g/dL (ref 13.0–17.0)
Immature Granulocytes: 1 %
Lymphocytes Relative: 10 %
Lymphs Abs: 0.8 10*3/uL (ref 0.7–4.0)
Monocytes Absolute: 1.2 10*3/uL — ABNORMAL HIGH (ref 0.1–1.0)
Monocytes Relative: 16 %
Neutro Abs: 5.5 10*3/uL (ref 1.7–7.7)
Neutrophils Relative %: 73 %
Platelets: 168 10*3/uL (ref 150–400)
WBC: 7.6 10*3/uL (ref 4.0–10.5)

## 2021-01-11 LAB — BASIC METABOLIC PANEL
Anion gap: 11 (ref 5–15)
BUN: <5 mg/dL — ABNORMAL LOW (ref 6–20)
CO2: 28 mmol/L (ref 22–32)
Calcium: 8.8 mg/dL — ABNORMAL LOW (ref 8.9–10.3)
Chloride: 79 mmol/L — ABNORMAL LOW (ref 98–111)
Creatinine, Ser: 0.57 mg/dL — ABNORMAL LOW (ref 0.61–1.24)
GFR, Estimated: >60 mL/min (ref >60)
Glucose, Bld: 151 mg/dL — ABNORMAL HIGH (ref 70–99)
Potassium: 3.1 mmol/L — ABNORMAL LOW (ref 3.5–5.1)
Sodium: 118 mmol/L — CL (ref 135–145)

## 2021-01-11 LAB — CBC
HCT: 37.9 % — ABNORMAL LOW (ref 39.0–52.0)
Hemoglobin: 13.8 g/dL (ref 13.0–17.0)
MCH: 34.5 pg — ABNORMAL HIGH (ref 26.0–34.0)
MCHC: 36.4 g/dL — ABNORMAL HIGH (ref 30.0–36.0)
MCV: 94.8 fL (ref 80.0–100.0)
Platelets: 169 10*3/uL (ref 150–400)
RBC: 4 MIL/uL — ABNORMAL LOW (ref 4.22–5.81)
RDW: 11.5 % (ref 11.5–15.5)
WBC: 6.7 10*3/uL (ref 4.0–10.5)
nRBC: 0 % (ref 0.0–0.2)

## 2021-01-11 LAB — TSH: TSH: 2.491 u[IU]/mL (ref 0.350–4.500)

## 2021-01-11 LAB — PHOSPHORUS: Phosphorus: 2.7 mg/dL (ref 2.5–4.6)

## 2021-01-11 LAB — URINALYSIS, ROUTINE W REFLEX MICROSCOPIC
Bilirubin Urine: NEGATIVE
Glucose, UA: NEGATIVE mg/dL
Hgb urine dipstick: NEGATIVE
Ketones, ur: NEGATIVE mg/dL
Leukocytes,Ua: NEGATIVE
Nitrite: NEGATIVE
Protein, ur: NEGATIVE mg/dL
Specific Gravity, Urine: 1.01 (ref 1.005–1.030)
pH: 7 (ref 5.0–8.0)

## 2021-01-11 LAB — OSMOLALITY, URINE: Osmolality, Ur: 170 mOsm/kg — ABNORMAL LOW (ref 300–900)

## 2021-01-11 LAB — CK: Total CK: 221 U/L (ref 49–397)

## 2021-01-11 LAB — MAGNESIUM
Magnesium: 1.3 mg/dL — ABNORMAL LOW (ref 1.7–2.4)
Magnesium: 1.4 mg/dL — ABNORMAL LOW (ref 1.7–2.4)

## 2021-01-11 LAB — HIV ANTIBODY (ROUTINE TESTING W REFLEX): HIV Screen 4th Generation wRfx: NONREACTIVE

## 2021-01-11 LAB — OSMOLALITY: Osmolality: 246 mOsm/kg — CL (ref 275–295)

## 2021-01-11 LAB — PROTIME-INR
INR: 1.1 (ref 0.8–1.2)
Prothrombin Time: 13.6 seconds (ref 11.4–15.2)

## 2021-01-11 LAB — SARS CORONAVIRUS 2 BY RT PCR (HOSPITAL ORDER, PERFORMED IN ~~LOC~~ HOSPITAL LAB): SARS Coronavirus 2: NEGATIVE

## 2021-01-11 LAB — SODIUM, URINE, RANDOM: Sodium, Ur: 33 mmol/L

## 2021-01-11 MED ORDER — MAGNESIUM SULFATE 4 GM/100ML IV SOLN
4.0000 g | Freq: Once | INTRAVENOUS | Status: AC
  Administered 2021-01-11: 4 g via INTRAVENOUS
  Filled 2021-01-11: qty 100

## 2021-01-11 MED ORDER — ENOXAPARIN SODIUM 60 MG/0.6ML ~~LOC~~ SOLN
60.0000 mg | SUBCUTANEOUS | Status: DC
  Administered 2021-01-11 – 2021-01-16 (×6): 60 mg via SUBCUTANEOUS
  Filled 2021-01-11 (×6): qty 0.6

## 2021-01-11 MED ORDER — LORAZEPAM 2 MG/ML IJ SOLN
0.0000 mg | Freq: Two times a day (BID) | INTRAMUSCULAR | Status: AC
  Administered 2021-01-13: 1 mg via INTRAVENOUS
  Administered 2021-01-14: 3 mg via INTRAVENOUS
  Administered 2021-01-14 – 2021-01-15 (×2): 2 mg via INTRAVENOUS
  Filled 2021-01-11 (×3): qty 2
  Filled 2021-01-11 (×2): qty 1

## 2021-01-11 MED ORDER — LORAZEPAM 1 MG PO TABS
1.0000 mg | ORAL_TABLET | ORAL | Status: AC | PRN
Start: 2021-01-11 — End: 2021-01-14
  Administered 2021-01-11 – 2021-01-12 (×5): 1 mg via ORAL
  Administered 2021-01-13: 2 mg via ORAL
  Administered 2021-01-13: 3 mg via ORAL
  Filled 2021-01-11 (×4): qty 1
  Filled 2021-01-11: qty 3
  Filled 2021-01-11: qty 2
  Filled 2021-01-11: qty 1

## 2021-01-11 MED ORDER — LORAZEPAM 2 MG/ML IJ SOLN
0.0000 mg | Freq: Four times a day (QID) | INTRAMUSCULAR | Status: AC
  Administered 2021-01-11 – 2021-01-12 (×2): 1 mg via INTRAVENOUS
  Filled 2021-01-11 (×2): qty 1

## 2021-01-11 MED ORDER — ADULT MULTIVITAMIN W/MINERALS CH
1.0000 | ORAL_TABLET | Freq: Every day | ORAL | Status: DC
  Administered 2021-01-11 – 2021-01-17 (×7): 1 via ORAL
  Filled 2021-01-11 (×7): qty 1

## 2021-01-11 MED ORDER — LORAZEPAM 2 MG/ML IJ SOLN: 1.0000 mg | INTRAMUSCULAR | Status: DC | PRN

## 2021-01-11 MED ORDER — LORAZEPAM 1 MG PO TABS: 1.0000 mg | ORAL_TABLET | ORAL | Status: DC | PRN

## 2021-01-11 MED ORDER — LORAZEPAM 2 MG/ML IJ SOLN
1.0000 mg | INTRAMUSCULAR | Status: AC | PRN
Start: 2021-01-11 — End: 2021-01-14
  Administered 2021-01-13: 4 mg via INTRAVENOUS
  Administered 2021-01-13 – 2021-01-14 (×2): 3 mg via INTRAVENOUS
  Administered 2021-01-14 (×2): 2 mg via INTRAVENOUS
  Filled 2021-01-11: qty 1
  Filled 2021-01-11: qty 2
  Filled 2021-01-11 (×2): qty 1

## 2021-01-11 MED ORDER — SODIUM CHLORIDE 0.9 % IV BOLUS
500.0000 mL | Freq: Once | INTRAVENOUS | Status: AC
  Administered 2021-01-11: 500 mL via INTRAVENOUS

## 2021-01-11 MED ORDER — SODIUM CHLORIDE 0.9 % IV BOLUS
1000.0000 mL | Freq: Once | INTRAVENOUS | Status: AC
  Administered 2021-01-11: 1000 mL via INTRAVENOUS

## 2021-01-11 MED ORDER — POTASSIUM CHLORIDE 10 MEQ/100ML IV SOLN
10.0000 meq | INTRAVENOUS | Status: AC
  Administered 2021-01-11 (×2): 10 meq via INTRAVENOUS
  Filled 2021-01-11 (×2): qty 100

## 2021-01-11 MED ORDER — SODIUM CHLORIDE 0.9 % IV SOLN: INTRAVENOUS | Status: DC

## 2021-01-11 MED ORDER — THIAMINE HCL 100 MG/ML IJ SOLN
100.0000 mg | Freq: Every day | INTRAMUSCULAR | Status: DC
  Filled 2021-01-11 (×3): qty 2

## 2021-01-11 MED ORDER — POTASSIUM CHLORIDE CRYS ER 20 MEQ PO TBCR
20.0000 meq | EXTENDED_RELEASE_TABLET | Freq: Once | ORAL | Status: AC
  Administered 2021-01-11: 20 meq via ORAL
  Filled 2021-01-11: qty 1

## 2021-01-11 MED ORDER — THIAMINE HCL 100 MG PO TABS
100.0000 mg | ORAL_TABLET | Freq: Every day | ORAL | Status: DC
  Administered 2021-01-11 – 2021-01-17 (×7): 100 mg via ORAL
  Filled 2021-01-11 (×7): qty 1

## 2021-01-11 MED ORDER — METOPROLOL SUCCINATE ER 50 MG PO TB24
50.0000 mg | ORAL_TABLET | Freq: Every day | ORAL | Status: DC
  Administered 2021-01-12 – 2021-01-17 (×6): 50 mg via ORAL
  Filled 2021-01-11 (×6): qty 1

## 2021-01-11 MED ORDER — HYDRALAZINE HCL 20 MG/ML IJ SOLN: 10.0000 mg | Freq: Three times a day (TID) | INTRAMUSCULAR | Status: DC | PRN

## 2021-01-11 MED ORDER — BUPROPION HCL ER (XL) 300 MG PO TB24
300.0000 mg | ORAL_TABLET | Freq: Every day | ORAL | Status: DC
  Administered 2021-01-12 – 2021-01-17 (×6): 300 mg via ORAL
  Filled 2021-01-11 (×6): qty 1

## 2021-01-11 MED ORDER — PANTOPRAZOLE SODIUM 40 MG PO TBEC
40.0000 mg | DELAYED_RELEASE_TABLET | Freq: Every day | ORAL | Status: DC
  Administered 2021-01-12 – 2021-01-17 (×6): 40 mg via ORAL
  Filled 2021-01-11 (×6): qty 1

## 2021-01-11 MED ORDER — FOLIC ACID 1 MG PO TABS
1.0000 mg | ORAL_TABLET | Freq: Every day | ORAL | Status: DC
  Administered 2021-01-11 – 2021-01-17 (×7): 1 mg via ORAL
  Filled 2021-01-11 (×7): qty 1

## 2021-01-11 NOTE — ED Notes (Signed)
Patient placed on cardiac monitor.

## 2021-01-11 NOTE — Progress Notes (Signed)
Date and time results received: 01/11/21  2125 (use smartphrase ".now" to insert current time)  Test: Sodium Critical Value:118  Name of Provider Notified:[facility]  Orders Received? Or Actions Taken?:Waiting for orders

## 2021-01-11 NOTE — ED Notes (Signed)
[facility] here for transport.  

## 2021-01-11 NOTE — Progress Notes (Signed)
Date and time results received: 01/11/21  Test: Low Osmolarity Critical Value: 246  Name of Provider Notified: [facility]  Orders Received? Or Actions Taken?: Awaiting orders

## 2021-01-11 NOTE — Plan of Care (Signed)
  Problem: Education: Goal: Knowledge of risk factors and measures for prevention of condition will improve Outcome: Progressing   Problem: Coping: Goal: Psychosocial and spiritual needs will be supported Outcome: Progressing   Problem: Respiratory: Goal: Will maintain a patent airway Outcome: Progressing Goal: Complications related to the disease process, condition or treatment will be avoided or minimized Outcome: Progressing   

## 2021-01-11 NOTE — ED Provider Notes (Signed)
[facility] EMERGENCY DEPARTMENT Provider Note   CSN: [id number] Arrival date & time: 01/11/21  1051     History Chief Complaint  Patient presents with  . Urinary Frequency    Jayden Kratochvil is a 50 y.o. male.  Patient is a 50 year old male with a history of hypertension, hyperlipidemia who is presenting today with complaints of fatigue, back pain and urinary symptoms.  Patient reported that he tested positive last Monday for Covid and is not vaccinated.  Today is day 10 of illness and he reported yesterday he started noticing some urinary frequency but denies any dysuria.  His urine looked very dark around 5 PM last night.  He tried to drink more fluids and did not urinate again until 12:30 AM.  He then drank water throughout the night and peed multiple times this morning but noticed this morning when he got up he was having pain in bilateral flanks but now just persistent in the right flank.  He has had generalized fatigue from the Covid but denies any persistent diarrhea.  He has had no nausea or vomiting.  He has had mild cough and occasional shortness of breath but denies excessive shortness of breath.  Patient does drink 12 beers daily and reports that he may have recently increased some of his alcohol use as he is drinking more liquor now.  He does not smoke or use drugs.  He continues to take his prescribed medications and those have not changed recently.  He has a history of kidney stone years and years ago but nothing recently.  The history is provided by the patient.  Urinary Frequency       Past Medical History:  Diagnosis Date  . Anxiety   . High cholesterol   . Hypertension     There are no problems to display for this patient.   Past Surgical History:  Procedure Laterality Date  . CYSTECTOMY    . VASECTOMY         Family History  Problem Relation Age of Onset  . Diabetes Mother   . High Cholesterol Mother   . COPD Father   . Heart failure Father      Social History   Tobacco Use  . Smoking status: Former Research scientist (life sciences)  . Smokeless tobacco: Current User    Types: Snuff  Vaping Use  . Vaping Use: Never used  Substance Use Topics  . Alcohol use: Yes    Comment: daily  . Drug use: No    Home Medications Prior to Admission medications   Medication Sig Start Date End Date Taking? Authorizing Provider  atorvastatin (LIPITOR) 20 MG tablet Take 20 mg by mouth daily.    [provider]  benazepril-hydrochlorthiazide (LOTENSIN HCT) 20-25 MG tablet Take 1 tablet by mouth daily.    [provider]  betamethasone dipropionate 0.05 % cream Apply topically 2 (two) times daily as needed (Rash). 03/10/20   Clark-Burning, Anderson Malta, PA-C  buPROPion (WELLBUTRIN) 100 MG tablet Take 100 mg by mouth 2 (two) times daily.    [provider]  fluticasone (FLONASE) 50 MCG/ACT nasal spray Place into both nostrils daily.    [provider]  metoprolol succinate (TOPROL-XL) 50 MG 24 hr tablet Take 50 mg by mouth daily. Take with or immediately following a meal.    [provider]  omeprazole (PRILOSEC) 20 MG capsule Take 20 mg by mouth daily.    [provider]    Allergies    Patient has  no known allergies.  Review of Systems   Review of Systems  Genitourinary: Positive for frequency.  All other systems reviewed and are negative.   Physical Exam Updated Vital Signs BP (!) 157/105 (BP Location: Right Arm)   Pulse 84   Temp 98.2 F (36.8 C) (Oral)   Resp 18   Ht 6' (1.829 m)   Wt 117.9 kg   SpO2 98%   BMI 35.26 kg/m   Physical Exam Vitals and nursing note reviewed.  Constitutional:      General: He is not in acute distress.    Appearance: He is well-developed and well-nourished. He is diaphoretic.  HENT:     Head: Normocephalic and atraumatic.     Nose: Nose normal.     Mouth/Throat:     Mouth: Oropharynx is clear and moist.  Eyes:     Extraocular Movements: EOM normal.      Conjunctiva/sclera: Conjunctivae normal.     Pupils: Pupils are equal, round, and reactive to light.  Cardiovascular:     Rate and Rhythm: Normal rate and regular rhythm.     Pulses: Intact distal pulses.     Heart sounds: No murmur heard.   Pulmonary:     Effort: Pulmonary effort is normal. No respiratory distress.     Breath sounds: Normal breath sounds. No wheezing or rales.  Abdominal:     General: There is no distension.     Palpations: Abdomen is soft.     Tenderness: There is no abdominal tenderness. There is right CVA tenderness. There is no left CVA tenderness, guarding or rebound.  Musculoskeletal:        General: No tenderness or edema. Normal range of motion.     Cervical back: Normal range of motion and neck supple.     Right lower leg: No edema.     Left lower leg: No edema.  Skin:    General: Skin is warm.     Findings: No erythema or rash.  Neurological:     General: No focal deficit present.     Mental Status: He is alert and oriented to person, place, and time. Mental status is at baseline.  Psychiatric:        Mood and Affect: Mood and affect and mood normal.        Behavior: Behavior normal.     ED Results / Procedures / Treatments   Labs (all labs ordered are listed, but only abnormal results are displayed) Labs Reviewed  COMPREHENSIVE METABOLIC PANEL - Abnormal; Notable for the following components:      Result Value   Sodium 115 (*)    Potassium 2.8 (*)    Chloride 75 (*)    Glucose, Bld 149 (*)    BUN <5 (*)    Creatinine, Ser 0.41 (*)    AST 211 (*)    ALT 123 (*)    Total Bilirubin 3.5 (*)    All other components within normal limits  SARS CORONAVIRUS 2 (TAT 6-24 HRS)  CK  URINALYSIS, ROUTINE W REFLEX MICROSCOPIC  CBC WITH DIFFERENTIAL/PLATELET  MAGNESIUM    EKG None  Radiology DG Chest Port 1 View  Result Date: 01/11/2021 CLINICAL DATA:  Shortness of breath COVID positive 214. EXAM: PORTABLE CHEST 1 VIEW COMPARISON:  Chest  radiograph November 14, 2017 FINDINGS: The heart size and mediastinal contours are within normal limits. Both lungs are clear. The visualized skeletal structures are unremarkable. IMPRESSION: No active disease. Electronically Signed   By:  Dahlia Bailiff MD   On: 01/11/2021 12:05    Procedures Procedures   Medications Ordered in ED Medications - No data to display  ED Course  I have reviewed the triage vital signs and the nursing notes.  Pertinent labs & imaging results that were available during my care of the patient were reviewed by me and considered in my medical decision making (see chart for details).    MDM Rules/Calculators/A&P                          50 year old male presenting today with complaints of fatigue, right-sided flank pain.  The flank pain started this morning in the setting of having some dark urine yesterday.  This is all in the setting of having Covid for the last 10 days.  He has had some mild shortness of breath but appears in no acute distress at this time.  He is diaphoretic on exam and reports that that happens from time to time but he has not had fever that he is aware of.  He has no abdominal pain at this time but concern for possible renal stone versus rhabdomyolysis versus dehydration versus UTI. Labs and chest x-ray are pending.  Patient has been tolerating p.o.'s and reports he has been drinking and eating the same as usual.  1:18 PM Patient's labs are consistent today with hyponatremia with a sodium of 115 and a potassium of 2.8 with hypokalemia.  LFTs are elevated with an AST of 211 and ALT of 123.  This is almost consistent with patient's history of heavy alcohol use.  Patient was given saline and potassium.  Magnesium is pending.  EKG without significant findings.  Patient will be admitted for treatment.  MDM Number of Diagnoses or Management Options   Amount and/or Complexity of Data Reviewed Clinical lab tests: ordered and reviewed Tests in the  radiology section of CPT: ordered and reviewed Tests in the medicine section of CPT: ordered and reviewed Decide to obtain previous medical records or to obtain history from someone other than the patient: yes Obtain history from someone other than the patient: yes Review and summarize past medical records: yes Discuss the patient with other providers: yes Independent visualization of images, tracings, or specimens: yes  Risk of Complications, Morbidity, and/or Mortality Presenting problems: moderate Diagnostic procedures: moderate Management options: moderate  Patient Progress Patient progress: stable  CRITICAL CARE Performed by: Tranae Laramie Total critical care time: 30 minutes Critical care time was exclusive of separately billable procedures and treating other patients. Critical care was necessary to treat or prevent imminent or life-threatening deterioration. Critical care was time spent personally by me on the following activities: development of treatment plan with patient and/or surrogate as well as nursing, discussions with consultants, evaluation of patient's response to treatment, examination of patient, obtaining history from patient or surrogate, ordering and performing treatments and interventions, ordering and review of laboratory studies, ordering and review of radiographic studies, pulse oximetry and re-evaluation of patient's condition.   Final Clinical Impression(s) / ED Diagnoses Final diagnoses:  Hypokalemia  Hyponatremia  COVID    Rx / DC Orders ED Discharge Orders    None       Blanchie Dessert, MD 01/11/21 1334

## 2021-01-11 NOTE — ED Triage Notes (Addendum)
Pt tested positive for COVID on 2/14. States yesterday started having a sensation of frequent urination and lower back pain and abdominal pain. Also complaining of tremors. Drinks 12 beers daily.

## 2021-01-11 NOTE — Progress Notes (Signed)
TOC CM referral for substance abuse counseling. TOC CM/CSW will follow up with pt about resources or treatment. [place], [facility] ED TOC CM [phone]

## 2021-01-11 NOTE — ED Notes (Signed)
Patient requesting food. MD secure chatted to see if patient may have food. Denies need for blanket at this time. Oriented to room with TV remote and call bell.

## 2021-01-11 NOTE — H&P (Addendum)
History and Physical        Hospital Admission Note Date: 01/11/2021  Patient name: Danny Richard Medical record number: [id number] Date of birth: 1970/11/21 Age: 50 y.o. Gender: male  PCP: Rolene Course, PA-C    Chief Complaint    Chief Complaint  Patient presents with  . Urinary Frequency      HPI:   This is a 50 year old male with past medical history of hypertension, hyperlipidemia, alcohol abuse who presented to [facility] with complaints of fatigue, back pain and decreased urinary frequency.  Additionally, patient states that he tested positive for COVID-19 last Monday.  States that since testing positive he has had decreased p.o. intake and yesterday at work he felt lightheaded and with fogginess.  He also noted decreased urinary output and dark urine and drank Pedialyte with improved urinary output throughout the night however today again at work he noticed similar symptoms which prompted him to come to the ED.  States that since getting IV fluids he has been feeling much better.  Admits to drinking 12 beers daily with his most recent drink this a.m.  Denies any auditory or visual hallucinations and no history of withdrawal seizures but is anxious currently.   ED Course: Afebrile, hemodynamically stable, on room air. Notable Labs: Sodium 115, K2.8, chloride 75, glucose 149, BUN 5, creatinine 0.41 magnesium 1.3, AST 211, ALT 123, T bili 3.5, COVID-19 negative. Notable Imaging: CXR-unremarkable. Patient received KCl, magnesium, 1 L NS bolus with maintenance fluid and Ativan.    Vitals:   01/11/21 1434 01/11/21 1542  BP: (!) 153/94 130/87  Pulse: 74 70  Resp: 18 18  Temp: 98.1 F (36.7 C) 98 F (36.7 C)  SpO2: 98% 98%     Review of Systems:  Review of Systems  All other systems reviewed and are negative.   Medical/Social/Family History   Past Medical History: Past  Medical History:  Diagnosis Date  . Anxiety   . High cholesterol   . Hypertension     Past Surgical History:  Procedure Laterality Date  . CYSTECTOMY    . VASECTOMY      Medications: Prior to Admission medications   Medication Sig Start Date End Date Taking? Authorizing Provider  ALPRAZolam (XANAX) 0.25 MG tablet Take 0.125 mg by mouth daily as needed for anxiety or sleep. 06/18/16  Yes [provider]  benazepril-hydrochlorthiazide (LOTENSIN HCT) 20-25 MG tablet Take 1 tablet by mouth daily.   Yes [provider]  betamethasone dipropionate 0.05 % cream Apply topically 2 (two) times daily as needed (Rash). Patient taking differently: Apply 1 application topically 2 (two) times daily as needed (Rash). 03/10/20  Yes Clark-Burning, Anderson Malta, PA-C  buPROPion (WELLBUTRIN XL) 300 MG 24 hr tablet Take 300 mg by mouth daily.   Yes [provider]  guaiFENesin (MUCINEX) 600 MG 12 hr tablet Take 600 mg by mouth 2 (two) times daily.   Yes [provider]  ibuprofen (ADVIL) 200 MG tablet Take 400 mg by mouth every 6 (six) hours as needed for mild pain.   Yes [provider]  metoprolol succinate (TOPROL-XL) 50 MG 24 hr tablet Take 50 mg by mouth daily. Take with or immediately following a meal.  Yes [provider]  omeprazole (PRILOSEC) 20 MG capsule Take 20 mg by mouth daily.   Yes [provider]  sildenafil (REVATIO) 20 MG tablet Take 20 mg by mouth daily as needed.   Yes [provider]    Allergies:  No Known Allergies  Social History:  reports that he has quit smoking. His smokeless tobacco use includes snuff. He reports current alcohol use. He reports that he does not use drugs.  Family History: Family History  Problem Relation Age of Onset  . Diabetes Mother   . High Cholesterol Mother   . COPD Father   . Heart failure Father      Objective   Physical Exam: Blood pressure 130/87, pulse 70, temperature  98 F (36.7 C), resp. rate 18, height 6' (1.829 m), weight 117.9 kg, SpO2 98 %.  Physical Exam Vitals and nursing note reviewed.  Constitutional:      Appearance: Normal appearance.  HENT:     Head: Normocephalic and atraumatic.  Eyes:     Conjunctiva/sclera: Conjunctivae normal.  Cardiovascular:     Rate and Rhythm: Normal rate and regular rhythm.  Pulmonary:     Effort: Pulmonary effort is normal.     Breath sounds: Normal breath sounds.  Abdominal:     General: Abdomen is flat.     Palpations: Abdomen is soft.  Musculoskeletal:        General: No swelling or tenderness.  Skin:    Coloration: Skin is not jaundiced or pale.  Neurological:     Mental Status: He is alert and oriented to person, place, and time.     Comments: Mild tremors  Psychiatric:        Mood and Affect: Mood normal.        Behavior: Behavior normal.     LABS on Admission: I have personally reviewed all the labs and imaging below    Basic Metabolic Panel: Recent Labs  Lab 01/11/21 1153 01/11/21 1623  NA 115* 120*  K 2.8* 3.2*  CL 75* 80*  CO2 26 26  GLUCOSE 149* 154*  BUN <5* <5*  CREATININE 0.41* 0.38*  CALCIUM 9.0 8.9  MG 1.3* 1.4*  PHOS  --  2.7   Liver Function Tests: Recent Labs  Lab 01/11/21 1153 01/11/21 1623  AST 211* 184*  ALT 123* 114*  ALKPHOS 84 84  BILITOT 3.5* 3.6*  PROT 7.6 7.6  ALBUMIN 4.0 3.8   No results for input(s): LIPASE, AMYLASE in the last 168 hours. No results for input(s): AMMONIA in the last 168 hours. CBC: Recent Labs  Lab 01/11/21 1153 01/11/21 1623  WBC 7.6 6.7  NEUTROABS 5.5  --   HGB 13.9 13.8  HCT RESULTS UNAVAILABLE DUE TO INTERFERING SUBSTANCE 37.9*  MCV RESULTS UNAVAILABLE DUE TO INTERFERING SUBSTANCE 94.8  PLT 168 169   Cardiac Enzymes: Recent Labs  Lab 01/11/21 1153  CKTOTAL 221   BNP: Invalid input(s): POCBNP CBG: No results for input(s): GLUCAP in the last 168 hours.  Radiological Exams on Admission:  DG Chest [place] 1  View  Result Date: 01/11/2021 CLINICAL DATA:  Shortness of breath COVID positive 214. EXAM: PORTABLE CHEST 1 VIEW COMPARISON:  Chest radiograph November 14, 2017 FINDINGS: The heart size and mediastinal contours are within normal limits. Both lungs are clear. The visualized skeletal structures are unremarkable. IMPRESSION: No active disease. Electronically Signed   By: Dahlia Bailiff MD   On: 01/11/2021 12:05      EKG: 1st  degree AV block, left axis deviation with sinus rhythm   A & P   Principal Problem:   Hyponatremia Active Problems:   Alcohol abuse   Alcohol withdrawal (HCC)   Hypomagnesemia   Hypokalemia   Elevated LFTs   1. Hyponatremia a. Likely multifactorial: Poor p.o. intake, alcohol abuse, possibly from Benzapril-HCTZ b. Hold Lotensin c. Check sodium studies d. Continue IV fluids and avoid rapid correction of sodium in 24 hours - will decrease NS rate from 125->75 mL/hr  2. Oliguria a. Likely from poor PO intake b. Plan as abive  3. Alcohol abuse  Alcohol withdrawal  a. Last drink was this AM b. Advised cessation c. CIWA protocol  4. Elevated LFTs, suspect alcoholic hepatitis a. AST 211, ALT 123, T bili 3.5 b. Check PT/INR c. RUQ [place]  5. Hypertension a. Hold home Lotensin b. Continue Toprol-XL c. Hydralazine as needed  6. Hypomagnesemia a. Replete IV  7. Hypokalemia a. Replete  8. Recent COVID 19 infection a. Reportedly tested positive 10 days ago with home test, currently negative. Can end isolation precatautions   DVT prophylaxis: lovenox   Code Status: Full Code  Diet: heart healthy Family Communication: Admission, patients condition and plan of care including tests being ordered have been discussed with the patient who indicates understanding and agrees with the plan and Code Status.  Disposition Plan: The appropriate patient status for this patient is INPATIENT. Inpatient status is judged to be reasonable and necessary in order to provide  the required intensity of service to ensure the patient's safety. The patient's presenting symptoms, physical exam findings, and initial radiographic and laboratory data in the context of their chronic comorbidities is felt to place them at high risk for further clinical deterioration. Furthermore, it is not anticipated that the patient will be medically stable for discharge from the hospital within 2 midnights of admission. The following factors support the patient status of inpatient.   " The patient's presenting symptoms include fatigue. " The worrisome physical exam findings include unremarkable. " The initial radiographic and laboratory data are worrisome because of elevated LFTs. " The chronic co-morbidities include alcohol abuse.   * I certify that at the point of admission it is my clinical judgment that the patient will require inpatient hospital care spanning beyond 2 midnights from the point of admission due to high intensity of service, high risk for further deterioration and high frequency of surveillance required.*   Status is: Inpatient  Remains inpatient appropriate because:IV treatments appropriate due to intensity of illness or inability to take PO and Inpatient level of care appropriate due to severity of illness   Dispo: The patient is from: Home              Anticipated d/c is to: Home              Anticipated d/c date is: 2 days              Patient currently is not medically stable to d/c.   Difficult to place patient No      Consultants  . none  Procedures  . none  Time Spent on Admission: 56 minutes    Harold Hedge, DO [facility]  01/11/2021, 5:52 PM

## 2021-01-11 NOTE — ED Notes (Signed)
Patient attempting to use bedside commode before hooking him up to fluids.

## 2021-01-12 ENCOUNTER — Inpatient Hospital Stay (HOSPITAL_COMMUNITY): Payer: No Typology Code available for payment source

## 2021-01-12 LAB — COMPREHENSIVE METABOLIC PANEL
ALT: 96 U/L — ABNORMAL HIGH (ref 0–44)
AST: 147 U/L — ABNORMAL HIGH (ref 15–41)
Albumin: 3.6 g/dL (ref 3.5–5.0)
Alkaline Phosphatase: 85 U/L (ref 38–126)
Anion gap: 11 (ref 5–15)
BUN: 6 mg/dL (ref 6–20)
CO2: 27 mmol/L (ref 22–32)
Calcium: 8.3 mg/dL — ABNORMAL LOW (ref 8.9–10.3)
Chloride: 81 mmol/L — ABNORMAL LOW (ref 98–111)
Creatinine, Ser: 0.55 mg/dL — ABNORMAL LOW (ref 0.61–1.24)
GFR, Estimated: >60 mL/min (ref >60)
Glucose, Bld: 154 mg/dL — ABNORMAL HIGH (ref 70–99)
Potassium: 3.2 mmol/L — ABNORMAL LOW (ref 3.5–5.1)
Sodium: 119 mmol/L — CL (ref 135–145)
Total Bilirubin: 3.7 mg/dL — ABNORMAL HIGH (ref 0.3–1.2)
Total Protein: 6.8 g/dL (ref 6.5–8.1)

## 2021-01-12 LAB — BASIC METABOLIC PANEL
Anion gap: 10 (ref 5–15)
Anion gap: 10 (ref 5–15)
Anion gap: 10 (ref 5–15)
Anion gap: 9 (ref 5–15)
BUN: 5 mg/dL — ABNORMAL LOW (ref 6–20)
BUN: 6 mg/dL (ref 6–20)
BUN: 5 mg/dL — ABNORMAL LOW (ref 6–20)
BUN: 7 mg/dL (ref 6–20)
CO2: 28 mmol/L (ref 22–32)
CO2: 28 mmol/L (ref 22–32)
CO2: 27 mmol/L (ref 22–32)
CO2: 25 mmol/L (ref 22–32)
Calcium: 8.5 mg/dL — ABNORMAL LOW (ref 8.9–10.3)
Calcium: 8.6 mg/dL — ABNORMAL LOW (ref 8.9–10.3)
Calcium: 8.7 mg/dL — ABNORMAL LOW (ref 8.9–10.3)
Calcium: 8.6 mg/dL — ABNORMAL LOW (ref 8.9–10.3)
Chloride: 83 mmol/L — ABNORMAL LOW (ref 98–111)
Chloride: 87 mmol/L — ABNORMAL LOW (ref 98–111)
Chloride: 89 mmol/L — ABNORMAL LOW (ref 98–111)
Chloride: 89 mmol/L — ABNORMAL LOW (ref 98–111)
Creatinine, Ser: 0.55 mg/dL — ABNORMAL LOW (ref 0.61–1.24)
Creatinine, Ser: 0.51 mg/dL — ABNORMAL LOW (ref 0.61–1.24)
Creatinine, Ser: 0.49 mg/dL — ABNORMAL LOW (ref 0.61–1.24)
Creatinine, Ser: 0.53 mg/dL — ABNORMAL LOW (ref 0.61–1.24)
GFR, Estimated: >60 mL/min (ref >60)
GFR, Estimated: >60 mL/min (ref >60)
GFR, Estimated: >60 mL/min (ref >60)
GFR, Estimated: >60 mL/min (ref >60)
Glucose, Bld: 154 mg/dL — ABNORMAL HIGH (ref 70–99)
Glucose, Bld: 125 mg/dL — ABNORMAL HIGH (ref 70–99)
Glucose, Bld: 133 mg/dL — ABNORMAL HIGH (ref 70–99)
Glucose, Bld: 104 mg/dL — ABNORMAL HIGH (ref 70–99)
Potassium: 3.2 mmol/L — ABNORMAL LOW (ref 3.5–5.1)
Potassium: 3.3 mmol/L — ABNORMAL LOW (ref 3.5–5.1)
Potassium: 3.4 mmol/L — ABNORMAL LOW (ref 3.5–5.1)
Potassium: 3.9 mmol/L (ref 3.5–5.1)
Sodium: 121 mmol/L — ABNORMAL LOW (ref 135–145)
Sodium: 125 mmol/L — ABNORMAL LOW (ref 135–145)
Sodium: 126 mmol/L — ABNORMAL LOW (ref 135–145)
Sodium: 123 mmol/L — ABNORMAL LOW (ref 135–145)

## 2021-01-12 LAB — CBC
HCT: 34.6 % — ABNORMAL LOW (ref 39.0–52.0)
Hemoglobin: 12.5 g/dL — ABNORMAL LOW (ref 13.0–17.0)
MCH: 35.1 pg — ABNORMAL HIGH (ref 26.0–34.0)
MCHC: 36.1 g/dL — ABNORMAL HIGH (ref 30.0–36.0)
MCV: 97.2 fL (ref 80.0–100.0)
Platelets: 162 10*3/uL (ref 150–400)
RBC: 3.56 MIL/uL — ABNORMAL LOW (ref 4.22–5.81)
RDW: 11.6 % (ref 11.5–15.5)
WBC: 6.3 10*3/uL (ref 4.0–10.5)
nRBC: 0 % (ref 0.0–0.2)

## 2021-01-12 MED ORDER — POTASSIUM CHLORIDE CRYS ER 20 MEQ PO TBCR
40.0000 meq | EXTENDED_RELEASE_TABLET | ORAL | Status: AC
  Administered 2021-01-12 (×2): 40 meq via ORAL
  Filled 2021-01-12 (×2): qty 2

## 2021-01-12 NOTE — Progress Notes (Signed)
PROGRESS NOTE    Danny Richard  TZG:[id number] DOB: [birth year] DOA: 01/11/2021 PCP: Rolene Course, PA-C     Brief Narrative:  Danny Richard is a 50 year old male with past medical history of hypertension, hyperlipidemia, alcohol abuse who presented to [facility] with complaints of fatigue, back pain and decreased urinary frequency.  Additionally, patient states that he tested positive for COVID-19 last Monday.  States that since testing positive he has had decreased p.o. intake and yesterday at work he felt lightheaded and with fogginess.  He also noted decreased urinary output and dark urine and drank Pedialyte with improved urinary output throughout the night however today again at work he noticed similar symptoms which prompted him to come to the ED.  States that since getting IV fluids he has been feeling much better.  Admits to drinking 12 beers daily with his most recent drink Tuesday night.  Patient was noted to have hyponatremia with sodium 115, hypokalemia 2.8.  COVID PCR negative.  Patient was started on IV fluids for hyponatremia and CIWA protocol for alcohol withdrawal.  New events last 24 hours / Subjective: Patient seen walking around the room.  Having a lot of urine output, feeling better overall.  Has an appetite today.  Assessment & Plan:   Principal Problem:   Hyponatremia Active Problems:   Alcohol abuse   Alcohol withdrawal (HCC)   Hypomagnesemia   Hypokalemia   Elevated LFTs   Hypoosmolar hyponatremia -Likely due to poor p.o. intake, possible contributing from benazepril-HCTZ -Improving with IV fluid -Continue to trend BMP  Alcohol abuse at risk of alcohol withdrawal -Continue to monitor CIWA  Elevated LFTs -Improving -RUQ US shows hepatic steatosis  Hypertension -Continue Toprol  Hypokalemia -Replace, trend  Recent COVID-19 -Repeat Covid PCR negative.  Can and isolation precaution  Depression/anxiety -Continue Wellbutrin   DVT prophylaxis:  Lovenox   Code Status: Full code Family Communication: Spouse at bedside Disposition Plan:  Status is: Inpatient  Remains inpatient appropriate because:Persistent severe electrolyte disturbances   Dispo: The patient is from: Home              Anticipated d/c is to: Home              Patient currently is not medically stable to d/c.   Difficult to place patient No      Consultants:   None  Procedures:   None   Antimicrobials:  Anti-infectives (From admission, onward)   None        Objective: Vitals:   01/11/21 1542 01/11/21 1700 01/11/21 2110 01/12/21 0554  BP: 130/87  (!) 151/92 (!) 141/91  Pulse: 70  78 80  Resp: 18  20 18   Temp: 98 F (36.7 C)  98.6 F (37 C) 99.8 F (37.7 C)  TempSrc:   Oral Oral  SpO2: 98%  96% 99%  Weight:  117.9 kg    Height:        Intake/Output Summary (Last 24 hours) at 01/12/2021 1105 Last data filed at 01/12/2021 0723 Gross per 24 hour  Intake 632.03 ml  Output 2250 ml  Net -1617.97 ml   Filed Weights   01/11/21 1105 01/11/21 1700  Weight: 117.9 kg 117.9 kg    Examination:  General exam: Appears calm and comfortable  Respiratory system: Clear to auscultation. Respiratory effort normal. No respiratory distress. No conversational dyspnea.  Cardiovascular system: S1 & S2 heard, RRR. No murmurs. No pedal edema. Gastrointestinal system: Abdomen is nondistended, soft and nontender. Normal bowel sounds heard. Central  nervous system: Alert and oriented. No focal neurological deficits. Speech clear.  Extremities: Symmetric in appearance  Skin: No rashes, lesions or ulcers on exposed skin  Psychiatry: Judgement and insight appear normal. Mood & affect appropriate.   Data Reviewed: I have personally reviewed following labs and imaging studies  CBC: Recent Labs  Lab 01/11/21 1153 01/11/21 1623 01/12/21 0023  WBC 7.6 6.7 6.3  NEUTROABS 5.5  --   --   HGB 13.9 13.8 12.5*  HCT RESULTS UNAVAILABLE DUE TO INTERFERING  SUBSTANCE 37.9* 34.6*  MCV RESULTS UNAVAILABLE DUE TO INTERFERING SUBSTANCE 94.8 97.2  PLT 168 169 637   Basic Metabolic Panel: Recent Labs  Lab 01/11/21 1153 01/11/21 1623 01/11/21 2003 01/12/21 0023 01/12/21 0840  NA 115* 120* 118* 119*  121* 125*  K 2.8* 3.2* 3.1* 3.2*  3.2* 3.3*  CL 75* 80* 79* 81*  83* 87*  CO2 26 26 28 27  28 28   GLUCOSE 149* 154* 151* 154*  154* 125*  BUN <5* <5* <5* 6  5* 6  CREATININE 0.41* 0.38* 0.57* 0.55*  0.55* 0.51*  CALCIUM 9.0 8.9 8.8* 8.3*  8.5* 8.6*  MG 1.3* 1.4*  --   --   --   PHOS  --  2.7  --   --   --    GFR: Estimated Creatinine Clearance: 146.4 mL/min (A) (by C-G formula based on SCr of 0.51 mg/dL (L)). Liver Function Tests: Recent Labs  Lab 01/11/21 1153 01/11/21 1623 01/12/21 0023  AST 211* 184* 147*  ALT 123* 114* 96*  ALKPHOS 84 84 85  BILITOT 3.5* 3.6* 3.7*  PROT 7.6 7.6 6.8  ALBUMIN 4.0 3.8 3.6   No results for input(s): LIPASE, AMYLASE in the last 168 hours. No results for input(s): AMMONIA in the last 168 hours. Coagulation Profile: Recent Labs  Lab 01/11/21 1633  INR 1.1   Cardiac Enzymes: Recent Labs  Lab 01/11/21 1153  CKTOTAL 221   BNP (last 3 results) No results for input(s): PROBNP in the last 8760 hours. HbA1C: No results for input(s): HGBA1C in the last 72 hours. CBG: No results for input(s): GLUCAP in the last 168 hours. Lipid Profile: No results for input(s): CHOL, HDL, LDLCALC, TRIG, CHOLHDL, LDLDIRECT in the last 72 hours. Thyroid Function Tests: Recent Labs    01/11/21 1623  TSH 2.491   Anemia Panel: No results for input(s): VITAMINB12, FOLATE, FERRITIN, TIBC, IRON, RETICCTPCT in the last 72 hours. Sepsis Labs: No results for input(s): PROCALCITON, LATICACIDVEN in the last 168 hours.  Recent Results (from the past 240 hour(s))  SARS Coronavirus 2 by RT PCR (hospital order, performed in [place] hospital lab)     Status: None   Collection Time: 01/11/21  1:30 PM  Result  Value Ref Range Status   SARS Coronavirus 2 NEGATIVE NEGATIVE Final    Comment: (NOTE) SARS-CoV-2 target nucleic acids are NOT DETECTED.  The SARS-CoV-2 RNA is generally detectable in upper and lower respiratory specimens during the acute phase of infection. The lowest concentration of SARS-CoV-2 viral copies this assay can detect is 250 copies / mL. A negative result does not preclude SARS-CoV-2 infection and should not be used as the sole basis for treatment or other patient management decisions.  A negative result may occur with improper specimen collection / handling, submission of specimen other than nasopharyngeal swab, presence of viral mutation(s) within the areas targeted by this assay, and inadequate number of viral copies (<250 copies / mL). A negative  result must be combined with clinical observations, patient history, and epidemiological information.  Fact Sheet for Patients:   StrictlyIdeas.no  Fact Sheet for Healthcare Providers: BankingDealers.co.za  This test is not yet approved or  cleared by the [place] FDA and has been authorized for detection and/or diagnosis of SARS-CoV-2 by FDA under an Emergency Use Authorization (EUA).  This EUA will remain in effect (meaning this test can be used) for the duration of the COVID-19 declaration under Section 564(b)(1) of the Act, 21 U.S.C. section 360bbb-3(b)(1), unless the authorization is terminated or revoked sooner.  Performed at [facility], [address], [place], [place] [address]       Radiology Studies: DG Chest [place] 1 View  Result Date: 01/11/2021 CLINICAL DATA:  Shortness of breath COVID positive 214. EXAM: PORTABLE CHEST 1 VIEW COMPARISON:  Chest radiograph November 14, 2017 FINDINGS: The heart size and mediastinal contours are within normal limits. Both lungs are clear. The visualized skeletal structures are unremarkable. IMPRESSION: No active  disease. Electronically Signed   By: Dahlia Bailiff MD   On: 01/11/2021 12:05   US Abdomen Limited RUQ (LIVER/GB)  Result Date: 01/12/2021 CLINICAL DATA:  Abnormal liver function tests. EXAM: ULTRASOUND ABDOMEN LIMITED RIGHT UPPER QUADRANT COMPARISON:  July 30, 2017. FINDINGS: Gallbladder: No gallstones visualized. No sonographic Murphy sign noted by sonographer. Gallbladder appears to be contracted. No definite pericholecystic fluid is noted. Common bile duct: Diameter: 4 mm which is within normal limits. Liver: No focal lesion identified. Increased echogenicity of hepatic parenchyma is noted suggesting hepatic steatosis. Portal vein is patent on color Doppler imaging with normal direction of blood flow towards the liver. Other: None. IMPRESSION: Probable hepatic steatosis. No other definite abnormality seen in the right upper quadrant of the abdomen. Electronically Signed   By: Marijo Conception M.D.   On: 01/12/2021 09:02      Scheduled Meds: . buPROPion  300 mg Oral Daily  . enoxaparin (LOVENOX) injection  60 mg Subcutaneous Q24H  . folic acid  1 mg Oral Daily  . LORazepam  0-4 mg Intravenous Q6H   Followed by  . [START ON 01/13/2021] LORazepam  0-4 mg Intravenous Q12H  . metoprolol succinate  50 mg Oral Daily  . multivitamin with minerals  1 tablet Oral Daily  . pantoprazole  40 mg Oral Daily  . potassium chloride  40 mEq Oral Q4H  . thiamine  100 mg Oral Daily   Or  . thiamine  100 mg Intravenous Daily   Continuous Infusions: . sodium chloride 100 mL/hr at 01/12/21 0846     LOS: 1 day      Time spent: 30 minutes   Dessa Phi, DO [facility] 01/12/2021, 11:05 AM   Available via Epic secure chat 7am-7pm After these hours, please refer to coverage provider listed on [email or web address]

## 2021-01-13 LAB — BASIC METABOLIC PANEL
Anion gap: 10 (ref 5–15)
Anion gap: 9 (ref 5–15)
Anion gap: 9 (ref 5–15)
Anion gap: 10 (ref 5–15)
Anion gap: 10 (ref 5–15)
BUN: 7 mg/dL (ref 6–20)
BUN: 7 mg/dL (ref 6–20)
BUN: 7 mg/dL (ref 6–20)
BUN: 6 mg/dL (ref 6–20)
BUN: 7 mg/dL (ref 6–20)
CO2: 23 mmol/L (ref 22–32)
CO2: 24 mmol/L (ref 22–32)
CO2: 25 mmol/L (ref 22–32)
CO2: 24 mmol/L (ref 22–32)
CO2: 23 mmol/L (ref 22–32)
Calcium: 8.3 mg/dL — ABNORMAL LOW (ref 8.9–10.3)
Calcium: 8.5 mg/dL — ABNORMAL LOW (ref 8.9–10.3)
Calcium: 8.4 mg/dL — ABNORMAL LOW (ref 8.9–10.3)
Calcium: 8.4 mg/dL — ABNORMAL LOW (ref 8.9–10.3)
Calcium: 8.4 mg/dL — ABNORMAL LOW (ref 8.9–10.3)
Chloride: 88 mmol/L — ABNORMAL LOW (ref 98–111)
Chloride: 89 mmol/L — ABNORMAL LOW (ref 98–111)
Chloride: 90 mmol/L — ABNORMAL LOW (ref 98–111)
Chloride: 90 mmol/L — ABNORMAL LOW (ref 98–111)
Chloride: 91 mmol/L — ABNORMAL LOW (ref 98–111)
Creatinine, Ser: 0.51 mg/dL — ABNORMAL LOW (ref 0.61–1.24)
Creatinine, Ser: 0.57 mg/dL — ABNORMAL LOW (ref 0.61–1.24)
Creatinine, Ser: 0.51 mg/dL — ABNORMAL LOW (ref 0.61–1.24)
Creatinine, Ser: 0.52 mg/dL — ABNORMAL LOW (ref 0.61–1.24)
Creatinine, Ser: 0.55 mg/dL — ABNORMAL LOW (ref 0.61–1.24)
GFR, Estimated: >60 mL/min (ref >60)
GFR, Estimated: >60 mL/min (ref >60)
GFR, Estimated: >60 mL/min (ref >60)
GFR, Estimated: >60 mL/min (ref >60)
GFR, Estimated: >60 mL/min (ref >60)
Glucose, Bld: 100 mg/dL — ABNORMAL HIGH (ref 70–99)
Glucose, Bld: 97 mg/dL (ref 70–99)
Glucose, Bld: 104 mg/dL — ABNORMAL HIGH (ref 70–99)
Glucose, Bld: 133 mg/dL — ABNORMAL HIGH (ref 70–99)
Glucose, Bld: 115 mg/dL — ABNORMAL HIGH (ref 70–99)
Potassium: 3.5 mmol/L (ref 3.5–5.1)
Potassium: 3.8 mmol/L (ref 3.5–5.1)
Potassium: 3.6 mmol/L (ref 3.5–5.1)
Potassium: 3.4 mmol/L — ABNORMAL LOW (ref 3.5–5.1)
Potassium: 3.6 mmol/L (ref 3.5–5.1)
Sodium: 121 mmol/L — ABNORMAL LOW (ref 135–145)
Sodium: 122 mmol/L — ABNORMAL LOW (ref 135–145)
Sodium: 124 mmol/L — ABNORMAL LOW (ref 135–145)
Sodium: 124 mmol/L — ABNORMAL LOW (ref 135–145)
Sodium: 124 mmol/L — ABNORMAL LOW (ref 135–145)

## 2021-01-13 LAB — HEPATIC FUNCTION PANEL
ALT: 79 U/L — ABNORMAL HIGH (ref 0–44)
AST: 106 U/L — ABNORMAL HIGH (ref 15–41)
Albumin: 3.7 g/dL (ref 3.5–5.0)
Alkaline Phosphatase: 81 U/L (ref 38–126)
Bilirubin, Direct: 2.2 mg/dL — ABNORMAL HIGH (ref 0.0–0.2)
Indirect Bilirubin: 2.3 mg/dL — ABNORMAL HIGH (ref 0.3–0.9)
Total Bilirubin: 4.5 mg/dL — ABNORMAL HIGH (ref 0.3–1.2)
Total Protein: 7.1 g/dL (ref 6.5–8.1)

## 2021-01-13 LAB — MAGNESIUM: Magnesium: 2 mg/dL (ref 1.7–2.4)

## 2021-01-13 MED ORDER — POTASSIUM CHLORIDE CRYS ER 20 MEQ PO TBCR
40.0000 meq | EXTENDED_RELEASE_TABLET | Freq: Once | ORAL | Status: AC
  Administered 2021-01-13: 40 meq via ORAL
  Filled 2021-01-13: qty 2

## 2021-01-13 NOTE — TOC Progression Note (Signed)
Transition of Care [facility]) - Progression Note    Patient Details  Name: Danny Richard MRN: [id number] Date of Birth: 10/06/1971  [facility] CM/SW Contact  Purcell Mouton, RN Phone Number: 01/13/2021, 3:07 PM  Clinical Narrative:    TOC will continue to follow for discharge needs.    Expected Discharge Plan: Home/Self Care Barriers to Discharge: No Barriers Identified  Expected Discharge Plan and Services Expected Discharge Plan: Home/Self Care       Living arrangements for the past 2 months: Single Family Home                                       Social Determinants of Health (SDOH) Interventions    Readmission Risk Interventions No flowsheet data found.

## 2021-01-13 NOTE — Progress Notes (Signed)
   01/13/21 2045  CIWA-Ar  BP 129/89  Pulse Rate 75  Nausea and Vomiting 0  Tactile Disturbances 0  Tremor 5  Auditory Disturbances 0  Paroxysmal Sweats 0  Visual Disturbances 4  Anxiety 2  Headache, Fullness in Head 0  Agitation 0  Orientation and Clouding of Sensorium 1  CIWA-Ar Total 12  Pt states that the 12'oclock looks like Mickey mouse head and the numbers are dancing around on the clock. Will medicate per md order.

## 2021-01-13 NOTE — Progress Notes (Signed)
PROGRESS NOTE    Wynne Jury  HYI:[id number] DOB: [birth year] DOA: 01/11/2021 PCP: Aura Dials, MD     Brief Narrative:  Deveon Kisiel is a 50 year old male with past medical history of hypertension, hyperlipidemia, alcohol abuse who presented to [facility] with complaints of fatigue, back pain and decreased urinary frequency.  Additionally, patient states that he tested positive for COVID-19 last Monday.  States that since testing positive he has had decreased p.o. intake and yesterday at work he felt lightheaded and with fogginess.  He also noted decreased urinary output and dark urine and drank Pedialyte with improved urinary output throughout the night however today again at work he noticed similar symptoms which prompted him to come to the ED.  States that since getting IV fluids he has been feeling much better.  Admits to drinking 12 beers daily with his most recent drink Tuesday night.  Patient was noted to have hyponatremia with sodium 115, hypokalemia 2.8.  COVID PCR negative.  Patient was started on IV fluids for hyponatremia and CIWA protocol for alcohol withdrawal.  New events last 24 hours / Subjective: Patient just waking up, confused this morning.  Wife at bedside states that patient typically takes about 30 minutes in the morning to get his bearings.  Only was able to eat a little bit yesterday.  Stated to his wife when he woke up this morning that he felt like he got hit by a truck.  Assessment & Plan:   Principal Problem:   Hyponatremia Active Problems:   Alcohol abuse   Alcohol withdrawal (HCC)   Hypomagnesemia   Hypokalemia   Elevated LFTs   Hypoosmolar hyponatremia -Likely due to poor p.o. intake, possible contributing from benazepril-HCTZ -Slight decrease overnight.  Increase IV fluid again and continue to trend BMP  Alcohol abuse at risk of alcohol withdrawal -Continue to monitor CIWA  Elevated LFTs -Improving -RUQ US shows hepatic  steatosis  Hypertension -Continue Toprol  Recent COVID-19 -Repeat Covid PCR negative.  Can and isolation precaution  Depression/anxiety -Continue Wellbutrin   DVT prophylaxis: Lovenox   Code Status: Full code Family Communication: Spouse at bedside Disposition Plan:  Status is: Inpatient  Remains inpatient appropriate because:Persistent severe electrolyte disturbances   Dispo: The patient is from: Home              Anticipated d/c is to: Home              Patient currently is not medically stable to d/c.  Remains on IV fluid for hyponatremia.   Difficult to place patient No      Consultants:   None  Procedures:   None   Antimicrobials:  Anti-infectives (From admission, onward)   None       Objective: Vitals:   01/12/21 0554 01/12/21 1429 01/12/21 2132 01/13/21 0721  BP: (!) 141/91 (!) 141/88 140/89 (!) 142/78  Pulse: 80 80 78 80  Resp: 18  18   Temp: 99.8 F (37.7 C) 98 F (36.7 C) 99.3 F (37.4 C)   TempSrc: Oral Oral Oral   SpO2: 99%     Weight:      Height:        Intake/Output Summary (Last 24 hours) at 01/13/2021 1028 Last data filed at 01/13/2021 1020 Gross per 24 hour  Intake 1926.72 ml  Output 300 ml  Net 1626.72 ml   Filed Weights   01/11/21 1105 01/11/21 1700  Weight: 117.9 kg 117.9 kg    Examination: General exam: Appears calm and  comfortable  Respiratory system: Clear to auscultation. Respiratory effort normal. Cardiovascular system: S1 & S2 heard, RRR. No pedal edema. Gastrointestinal system: Abdomen is nondistended, soft and nontender. Normal bowel sounds heard. Central nervous system: Alert, mild tremors Extremities: Symmetric in appearance bilaterally  Skin: No rashes, lesions or ulcers on exposed skin  Psychiatry: Judgement and insight appear poor, slightly confused this morning  Data Reviewed: I have personally reviewed following labs and imaging studies  CBC: Recent Labs  Lab 01/11/21 1153 01/11/21 1623  01/12/21 0023  WBC 7.6 6.7 6.3  NEUTROABS 5.5  --   --   HGB 13.9 13.8 12.5*  HCT RESULTS UNAVAILABLE DUE TO INTERFERING SUBSTANCE 37.9* 34.6*  MCV RESULTS UNAVAILABLE DUE TO INTERFERING SUBSTANCE 94.8 97.2  PLT 168 169 706   Basic Metabolic Panel: Recent Labs  Lab 01/11/21 1153 01/11/21 1623 01/11/21 2003 01/12/21 0840 01/12/21 1437 01/12/21 2032 01/13/21 0126 01/13/21 0713  NA 115* 120*   < > 125* 126* 123* 122* 124*  K 2.8* 3.2*   < > 3.3* 3.4* 3.9 3.5 3.6  CL 75* 80*   < > 87* 89* 89* 89* 90*  CO2 26 26   < > 28 27 25 23 25   GLUCOSE 149* 154*   < > 125* 133* 104* 100* 104*  BUN <5* <5*   < > 6 5* 7 7 7   CREATININE 0.41* 0.38*   < > 0.51* 0.49* 0.53* 0.51* 0.51*  CALCIUM 9.0 8.9   < > 8.6* 8.7* 8.6* 8.5* 8.4*  MG 1.3* 1.4*  --   --   --   --  2.0  --   PHOS  --  2.7  --   --   --   --   --   --    < > = values in this interval not displayed.   GFR: Estimated Creatinine Clearance: 146.4 mL/min (A) (by C-G formula based on SCr of 0.51 mg/dL (L)). Liver Function Tests: Recent Labs  Lab 01/11/21 1153 01/11/21 1623 01/12/21 0023 01/13/21 0126  AST 211* 184* 147* 106*  ALT 123* 114* 96* 79*  ALKPHOS 84 84 85 81  BILITOT 3.5* 3.6* 3.7* 4.5*  PROT 7.6 7.6 6.8 7.1  ALBUMIN 4.0 3.8 3.6 3.7   No results for input(s): LIPASE, AMYLASE in the last 168 hours. No results for input(s): AMMONIA in the last 168 hours. Coagulation Profile: Recent Labs  Lab 01/11/21 1633  INR 1.1   Cardiac Enzymes: Recent Labs  Lab 01/11/21 1153  CKTOTAL 221   BNP (last 3 results) No results for input(s): PROBNP in the last 8760 hours. HbA1C: No results for input(s): HGBA1C in the last 72 hours. CBG: No results for input(s): GLUCAP in the last 168 hours. Lipid Profile: No results for input(s): CHOL, HDL, LDLCALC, TRIG, CHOLHDL, LDLDIRECT in the last 72 hours. Thyroid Function Tests: Recent Labs    01/11/21 1623  TSH 2.491   Anemia Panel: No results for input(s): VITAMINB12,  FOLATE, FERRITIN, TIBC, IRON, RETICCTPCT in the last 72 hours. Sepsis Labs: No results for input(s): PROCALCITON, LATICACIDVEN in the last 168 hours.  Recent Results (from the past 240 hour(s))  SARS Coronavirus 2 by RT PCR (hospital order, performed in [facility] hospital lab)     Status: None   Collection Time: 01/11/21  1:30 PM  Result Value Ref Range Status   SARS Coronavirus 2 NEGATIVE NEGATIVE Final    Comment: (NOTE) SARS-CoV-2 target nucleic acids are NOT DETECTED.  The  SARS-CoV-2 RNA is generally detectable in upper and lower respiratory specimens during the acute phase of infection. The lowest concentration of SARS-CoV-2 viral copies this assay can detect is 250 copies / mL. A negative result does not preclude SARS-CoV-2 infection and should not be used as the sole basis for treatment or other patient management decisions.  A negative result may occur with improper specimen collection / handling, submission of specimen other than nasopharyngeal swab, presence of viral mutation(s) within the areas targeted by this assay, and inadequate number of viral copies (<250 copies / mL). A negative result must be combined with clinical observations, patient history, and epidemiological information.  Fact Sheet for Patients:   StrictlyIdeas.no  Fact Sheet for Healthcare Providers: BankingDealers.co.za  This test is not yet approved or  cleared by the [place] FDA and has been authorized for detection and/or diagnosis of SARS-CoV-2 by FDA under an Emergency Use Authorization (EUA).  This EUA will remain in effect (meaning this test can be used) for the duration of the COVID-19 declaration under Section 564(b)(1) of the Act, 21 U.S.C. section 360bbb-3(b)(1), unless the authorization is terminated or revoked sooner.  Performed at [facility], [address], [place], [place] [address]       Radiology Studies: DG  Chest [place] 1 View  Result Date: 01/11/2021 CLINICAL DATA:  Shortness of breath COVID positive 214. EXAM: PORTABLE CHEST 1 VIEW COMPARISON:  Chest radiograph November 14, 2017 FINDINGS: The heart size and mediastinal contours are within normal limits. Both lungs are clear. The visualized skeletal structures are unremarkable. IMPRESSION: No active disease. Electronically Signed   By: Dahlia Bailiff MD   On: 01/11/2021 12:05   US Abdomen Limited RUQ (LIVER/GB)  Result Date: 01/12/2021 CLINICAL DATA:  Abnormal liver function tests. EXAM: ULTRASOUND ABDOMEN LIMITED RIGHT UPPER QUADRANT COMPARISON:  July 30, 2017. FINDINGS: Gallbladder: No gallstones visualized. No sonographic Murphy sign noted by sonographer. Gallbladder appears to be contracted. No definite pericholecystic fluid is noted. Common bile duct: Diameter: 4 mm which is within normal limits. Liver: No focal lesion identified. Increased echogenicity of hepatic parenchyma is noted suggesting hepatic steatosis. Portal vein is patent on color Doppler imaging with normal direction of blood flow towards the liver. Other: None. IMPRESSION: Probable hepatic steatosis. No other definite abnormality seen in the right upper quadrant of the abdomen. Electronically Signed   By: Marijo Conception M.D.   On: 01/12/2021 09:02      Scheduled Meds: . buPROPion  300 mg Oral Daily  . enoxaparin (LOVENOX) injection  60 mg Subcutaneous Q24H  . folic acid  1 mg Oral Daily  . LORazepam  0-4 mg Intravenous Q6H   Followed by  . LORazepam  0-4 mg Intravenous Q12H  . metoprolol succinate  50 mg Oral Daily  . multivitamin with minerals  1 tablet Oral Daily  . pantoprazole  40 mg Oral Daily  . thiamine  100 mg Oral Daily   Or  . thiamine  100 mg Intravenous Daily   Continuous Infusions: . sodium chloride 100 mL/hr at 01/13/21 0917     LOS: 2 days      Time spent: 25 minutes   Dessa Phi, DO [facility] 01/13/2021, 10:28 AM   Available via  Epic secure chat 7am-7pm After these hours, please refer to coverage provider listed on [email or web address]

## 2021-01-14 LAB — BASIC METABOLIC PANEL
Anion gap: 9 (ref 5–15)
Anion gap: 9 (ref 5–15)
BUN: 7 mg/dL (ref 6–20)
BUN: 5 mg/dL — ABNORMAL LOW (ref 6–20)
CO2: 25 mmol/L (ref 22–32)
CO2: 24 mmol/L (ref 22–32)
Calcium: 8.3 mg/dL — ABNORMAL LOW (ref 8.9–10.3)
Calcium: 8.3 mg/dL — ABNORMAL LOW (ref 8.9–10.3)
Chloride: 95 mmol/L — ABNORMAL LOW (ref 98–111)
Chloride: 95 mmol/L — ABNORMAL LOW (ref 98–111)
Creatinine, Ser: 0.57 mg/dL — ABNORMAL LOW (ref 0.61–1.24)
Creatinine, Ser: 0.7 mg/dL (ref 0.61–1.24)
GFR, Estimated: >60 mL/min (ref >60)
GFR, Estimated: >60 mL/min (ref >60)
Glucose, Bld: 104 mg/dL — ABNORMAL HIGH (ref 70–99)
Glucose, Bld: 102 mg/dL — ABNORMAL HIGH (ref 70–99)
Potassium: 3.5 mmol/L (ref 3.5–5.1)
Potassium: 3.6 mmol/L (ref 3.5–5.1)
Sodium: 129 mmol/L — ABNORMAL LOW (ref 135–145)
Sodium: 128 mmol/L — ABNORMAL LOW (ref 135–145)

## 2021-01-14 LAB — HEPATIC FUNCTION PANEL
ALT: 59 U/L — ABNORMAL HIGH (ref 0–44)
AST: 81 U/L — ABNORMAL HIGH (ref 15–41)
Albumin: 3.4 g/dL — ABNORMAL LOW (ref 3.5–5.0)
Alkaline Phosphatase: 78 U/L (ref 38–126)
Bilirubin, Direct: 1.3 mg/dL — ABNORMAL HIGH (ref 0.0–0.2)
Indirect Bilirubin: 1.4 mg/dL — ABNORMAL HIGH (ref 0.3–0.9)
Total Bilirubin: 2.7 mg/dL — ABNORMAL HIGH (ref 0.3–1.2)
Total Protein: 7 g/dL (ref 6.5–8.1)

## 2021-01-14 MED ORDER — LORAZEPAM 2 MG/ML IJ SOLN
1.0000 mg | INTRAMUSCULAR | Status: AC | PRN
  Administered 2021-01-14 (×2): 2 mg via INTRAVENOUS
  Administered 2021-01-15: 3 mg via INTRAVENOUS
  Administered 2021-01-15: 2 mg via INTRAVENOUS
  Filled 2021-01-14: qty 1
  Filled 2021-01-14: qty 2
  Filled 2021-01-14 (×2): qty 1

## 2021-01-14 MED ORDER — LORAZEPAM 1 MG PO TABS
1.0000 mg | ORAL_TABLET | ORAL | Status: AC | PRN
  Administered 2021-01-15: 2 mg via ORAL
  Administered 2021-01-15: 3 mg via ORAL
  Administered 2021-01-16 (×6): 1 mg via ORAL
  Administered 2021-01-17: 2 mg via ORAL
  Administered 2021-01-17 (×3): 1 mg via ORAL
  Filled 2021-01-14 (×7): qty 1
  Filled 2021-01-14: qty 3
  Filled 2021-01-14 (×2): qty 1
  Filled 2021-01-14 (×2): qty 2

## 2021-01-14 MED ORDER — HALOPERIDOL LACTATE 5 MG/ML IJ SOLN
2.0000 mg | Freq: Four times a day (QID) | INTRAMUSCULAR | Status: DC | PRN
  Administered 2021-01-14 (×3): 2 mg via INTRAVENOUS
  Filled 2021-01-14 (×3): qty 1

## 2021-01-14 MED ORDER — SODIUM CHLORIDE 1 G PO TABS: 2.0000 g | ORAL_TABLET | Freq: Two times a day (BID) | ORAL | Status: DC

## 2021-01-14 MED ORDER — AMLODIPINE BESYLATE 5 MG PO TABS
5.0000 mg | ORAL_TABLET | Freq: Every day | ORAL | Status: DC
  Administered 2021-01-14 – 2021-01-17 (×4): 5 mg via ORAL
  Filled 2021-01-14 (×4): qty 1

## 2021-01-14 MED ORDER — LIP MEDEX EX OINT: TOPICAL_OINTMENT | CUTANEOUS | Status: DC | PRN

## 2021-01-14 NOTE — Plan of Care (Signed)
  Problem: Health Behavior/Discharge Planning: Goal: Ability to manage health-related needs will improve 01/14/2021 2039 by Glenna Fellows D, RN Outcome: Progressing 01/14/2021 2038 by Glenna Fellows D, RN Outcome: Progressing   Problem: Clinical Measurements: Goal: Ability to maintain clinical measurements within normal limits will improve 01/14/2021 2039 by Glenna Fellows D, RN Outcome: Progressing 01/14/2021 2038 by Glenna Fellows D, RN Outcome: Progressing Goal: Will remain free from infection 01/14/2021 2039 by Glenna Fellows D, RN Outcome: Progressing 01/14/2021 2038 by Glenna Fellows D, RN Outcome: Progressing Goal: Diagnostic test results will improve 01/14/2021 2039 by Glenna Fellows D, RN Outcome: Progressing 01/14/2021 2038 by Glenna Fellows D, RN Outcome: Progressing Goal: Respiratory complications will improve 01/14/2021 2039 by Glenna Fellows D, RN Outcome: Progressing 01/14/2021 2038 by Glenna Fellows D, RN Outcome: Progressing Goal: Cardiovascular complication will be avoided 01/14/2021 2039 by Glenna Fellows D, RN Outcome: Progressing 01/14/2021 2038 by Glenna Fellows D, RN Outcome: Progressing   Problem: Activity: Goal: Risk for activity intolerance will decrease 01/14/2021 2039 by Glenna Fellows D, RN Outcome: Progressing 01/14/2021 2038 by Glenna Fellows D, RN Outcome: Progressing   Problem: Nutrition: Goal: Adequate nutrition will be maintained 01/14/2021 2039 by Glenna Fellows D, RN Outcome: Progressing 01/14/2021 2038 by Glenna Fellows D, RN Outcome: Progressing   Problem: Coping: Goal: Level of anxiety will decrease 01/14/2021 2039 by Glenna Fellows D, RN Outcome: Progressing 01/14/2021 2038 by Glenna Fellows D, RN Outcome: Progressing   Problem: Elimination: Goal: Will not experience complications related to bowel motility 01/14/2021 2039 by Glenna Fellows D, RN Outcome: Progressing 01/14/2021 2038 by Glenna Fellows D, RN Outcome: Progressing Goal:  Will not experience complications related to urinary retention 01/14/2021 2039 by Tula Nakayama, RN Outcome: Progressing 01/14/2021 2038 by Glenna Fellows D, RN Outcome: Progressing   Problem: Pain Managment: Goal: General experience of comfort will improve 01/14/2021 2039 by Glenna Fellows D, RN Outcome: Progressing 01/14/2021 2038 by Glenna Fellows D, RN Outcome: Progressing   Problem: Safety: Goal: Ability to remain free from injury will improve 01/14/2021 2039 by Glenna Fellows D, RN Outcome: Progressing 01/14/2021 2038 by Glenna Fellows D, RN Outcome: Progressing   Problem: Skin Integrity: Goal: Risk for impaired skin integrity will decrease 01/14/2021 2039 by Glenna Fellows D, RN Outcome: Progressing 01/14/2021 2038 by Glenna Fellows D, RN Outcome: Progressing   Problem: Education: Goal: Knowledge of disease or condition will improve Outcome: Progressing Goal: Understanding of discharge needs will improve Outcome: Progressing   Problem: Health Behavior/Discharge Planning: Goal: Ability to identify changes in lifestyle to reduce recurrence of condition will improve Outcome: Progressing Goal: Identification of resources available to assist in meeting health care needs will improve Outcome: Progressing   Problem: Physical Regulation: Goal: Complications related to the disease process, condition or treatment will be avoided or minimized Outcome: Progressing   Problem: Safety: Goal: Ability to remain free from injury will improve Outcome: Progressing

## 2021-01-14 NOTE — Progress Notes (Signed)
Rapid Response nurse came to check on pt and noted pt asleep at this time.  Informed about the happenings of the night since last call.  Aware that telemetry (central tele has been made aware) has not been placed on pt d/t 1. The patient is asleep, 2. Awakening the patient could cause sleep disturbance which may exacerbate withdrawal symptoms. Hoping that rest will help with decreasing severity of withdrawal symptoms.

## 2021-01-14 NOTE — Progress Notes (Signed)
Pt is asleep at this time and has not been re-scored.  Lab came to draw labs but advised to let pt rest as he has had a tumultuous night. Lab to draw around 0530 this am.  Charge RN called and is aware and agrees to plan. Will continue to monitor and note changes in pt as needed.

## 2021-01-14 NOTE — Progress Notes (Signed)
Pt found  Walking in hallway naked.  Multiple staff assisted pt back to room with much verbal coaching.  Pt returned to room.  Previous IV that was started was removed by pt and pt was bleeding.  New IV started in LFA.  Pt assisted back to bed.  Pt still confused.  Attempts to reorient pt has failed. Follows simple commands.  Report will be given to oncoming shift about the events of the evening.

## 2021-01-14 NOTE — Progress Notes (Addendum)
Writer came to the room and noted 2 staff members assisting pt to bsc d/t unsteady gait.  Staff nurse stated the pt asked for scissors and a knife.  Staff nurse stated for what and pt stated "I want to show you a trick."  Pt did not become aggressive.  Pt is still disoriented and is taking time to reorient pt.  Pt still does not comprehend.  Pt states he wants to go home. Pt given reason why he can not go home at this time.

## 2021-01-14 NOTE — Progress Notes (Signed)
   01/14/21 1951 01/14/21 2007  CIWA-Ar  BP  --  (!) 148/95  Pulse Rate 94 82  Nausea and Vomiting 0  --   Tactile Disturbances 0  --   Tremor 7  --   Auditory Disturbances 0  --   Paroxysmal Sweats 4  --   Visual Disturbances 0  --   Anxiety 2  --   Headache, Fullness in Head 0  --   Agitation 0  --   Orientation and Clouding of Sensorium 1  --   CIWA-Ar Total 14  --    Pt with severe tremors and sweaty.  Some anxiety noted as pt is more active than normal.  Pt has clouded sensorium and is unable to tell me why he is here and situations surrounding.  Pt was standing in room fiddling with clothes wife bought from home.  Pt is very unsteady on his feet and still remains impulsive.  Pt coached into coming to the bed and sitting down so he does not fall.  Pt is cooperative although had to be instructed multiple times. Vss taken at this time.  Pt is slightly hypertensive.  Will continue to monitor.

## 2021-01-14 NOTE — Progress Notes (Signed)
Two calls during the night  r/t to pt agitation, however pt is sleeping when I arrive after receiving ativan iv for CIWA protocol coverage.  Pt VS has been stable.  CRFVOHK,GO informed to call if unable to maintain pt at any point during his care.

## 2021-01-14 NOTE — Progress Notes (Signed)
Pt with some agitation noted.  Pt standing very unsteady on feet.  He is idle with his hands.  Pt redirected to sit down on bed.  Pt has done so but much coaxing is needed.  Pt assisted from bed to chair and instructed to return to bed at this time.  Pt has completed the task. Will continue to monitor.

## 2021-01-14 NOTE — Progress Notes (Signed)
Pt does not have IVF infusing d/t pt impulsiveness. This will place pt at an increased fall risk.   Will notify MD for alternatives.

## 2021-01-14 NOTE — Progress Notes (Signed)
   01/13/21 2347  CIWA-Ar  BP (!) 137/97  Pulse Rate (!) 101  Nausea and Vomiting 0  Tactile Disturbances 7  Tremor 7  Auditory Disturbances 0  Paroxysmal Sweats 4  Visual Disturbances 0  Anxiety 2  Headache, Fullness in Head 0  Agitation 2  Orientation and Clouding of Sensorium 3  CIWA-Ar Total 25  Pt was out at the nurses station.  Writer came out of another room to find staff assisting pt back to the room with a wheelchair.  Pt is agitated and is able to be redirected with much reinforcement.  Pt has also broke IV extension set in half.  IV removed by nursing staff and another IV was placed.  Pt was argumentative about going home, asking for clothing.  Explained situation and pt complies with much reluctance.  Pt given IV ativan for CIWA score.  Charge nurse and two other staff members at bedside currently.  Pt was cleaned up and pt returned back to bed.  To note pt is very unsteady on his feet and is impulsive.  Pt is alert to self at this point.  Will reorient pt as needed as long as increased agitation is not noted.  Per protocol Rapid Response will be called.

## 2021-01-14 NOTE — Progress Notes (Signed)
PROGRESS NOTE    Danny Richard  STM:[id number] DOB: [birth year] DOA: 01/11/2021 PCP: Aura Dials, MD     Brief Narrative:  Danny Richard is a 50 year old male with past medical history of hypertension, hyperlipidemia, alcohol abuse who presented to [facility] with complaints of fatigue, back pain and decreased urinary frequency.  Additionally, patient states that he tested positive for COVID-19 last Monday.  States that since testing positive he has had decreased p.o. intake and yesterday at work he felt lightheaded and with fogginess.  He also noted decreased urinary output and dark urine and drank Pedialyte with improved urinary output throughout the night however today again at work he noticed similar symptoms which prompted him to come to the ED.  States that since getting IV fluids he has been feeling much better.  Admits to drinking 12 beers daily with his most recent drink Tuesday night.  Patient was noted to have hyponatremia with sodium 115, hypokalemia 2.8.  COVID PCR negative.  Patient was started on IV fluids for hyponatremia and CIWA protocol for alcohol withdrawal.  New events last 24 hours / Subjective: Patient with significant agitation, elevated CIWA score overnight for alcohol withdrawal. By my arrival to evaluate patient, patient was calm in bed and had just received IV Ativan and IV Haldol. He is alert and oriented x self, place, situation. Remains tremorous.  Assessment & Plan:   Principal Problem:   Hyponatremia Active Problems:   Alcohol abuse   Alcohol withdrawal (HCC)   Hypomagnesemia   Hypokalemia   Elevated LFTs   Hypoosmolar hyponatremia -Likely due to poor p.o. intake, possible contributing from benazepril-HCTZ -Improving. Continue IV fluid  Alcohol abuse at risk of alcohol withdrawal -Continue to monitor CIWA  Elevated LFTs -Improving -RUQ US shows hepatic steatosis  Hypertension -Continue Toprol. Add Norvasc today  Recent COVID-19 -Repeat Covid PCR  negative.  Can and isolation precaution  Depression/anxiety -Continue Wellbutrin   DVT prophylaxis: Lovenox   Code Status: Full code Family Communication: Spouse at bedside Disposition Plan:  Status is: Inpatient  Remains inpatient appropriate because:Persistent severe electrolyte disturbances and Altered mental status   Dispo: The patient is from: Home              Anticipated d/c is to: Home              Patient currently is not medically stable to d/c.  Remains on IV fluid for hyponatremia. Continue CIWA protocol   Difficult to place patient No      Consultants:   None  Procedures:   None   Antimicrobials:  Anti-infectives (From admission, onward)   None       Objective: Vitals:   01/14/21 0753 01/14/21 0837 01/14/21 1116 01/14/21 1151  BP: (!) 163/111 (!) 164/100 140/87 (!) 160/101  Pulse: 84 87 80 73  Resp: 18  18 16   Temp: 99 F (37.2 C)  99 F (37.2 C)   TempSrc:      SpO2: 98%  98% 100%  Weight:      Height:        Intake/Output Summary (Last 24 hours) at 01/14/2021 1237 Last data filed at 01/14/2021 8921 Gross per 24 hour  Intake 1317.85 ml  Output 1400 ml  Net -82.15 ml   Filed Weights   01/11/21 1105 01/11/21 1700  Weight: 117.9 kg 117.9 kg    Examination: General exam: Appears calm and comfortable  Respiratory system: Clear to auscultation. Respiratory effort normal. Cardiovascular system: S1 & S2 heard, RRR.  No pedal edema. Gastrointestinal system: Abdomen is nondistended, soft and nontender. Normal bowel sounds heard. Central nervous system: Alert and oriented. Non focal exam. Speech clear. Tremors present Extremities: Symmetric in appearance bilaterally  Skin: No rashes, lesions or ulcers on exposed skin  Psychiatry: Stable  Data Reviewed: I have personally reviewed following labs and imaging studies  CBC: Recent Labs  Lab 01/11/21 1153 01/11/21 1623 01/12/21 0023  WBC 7.6 6.7 6.3  NEUTROABS 5.5  --   --   HGB 13.9  13.8 12.5*  HCT RESULTS UNAVAILABLE DUE TO INTERFERING SUBSTANCE 37.9* 34.6*  MCV RESULTS UNAVAILABLE DUE TO INTERFERING SUBSTANCE 94.8 97.2  PLT 168 169 355   Basic Metabolic Panel: Recent Labs  Lab 01/11/21 1153 01/11/21 1623 01/11/21 2003 01/13/21 0126 01/13/21 0713 01/13/21 1043 01/13/21 2218 01/14/21 0525 01/14/21 1037  NA 115* 120*   < > 121*  122* 124* 124* 124* 129* 128*  K 2.8* 3.2*   < > 3.8  3.5 3.6 3.4* 3.6 3.5 3.6  CL 75* 80*   < > 88*  89* 90* 90* 91* 95* 95*  CO2 26 26   < > 24  23 25 24 23 25 24   GLUCOSE 149* 154*   < > 97  100* 104* 133* 115* 104* 102*  BUN <5* <5*   < > 7  7 7 6 7 7  5*  CREATININE 0.41* 0.38*   < > 0.57*  0.51* 0.51* 0.52* 0.55* 0.57* 0.70  CALCIUM 9.0 8.9   < > 8.3*  8.5* 8.4* 8.4* 8.4* 8.3* 8.3*  MG 1.3* 1.4*  --  2.0  --   --   --   --   --   PHOS  --  2.7  --   --   --   --   --   --   --    < > = values in this interval not displayed.   GFR: Estimated Creatinine Clearance: 146.4 mL/min (by C-G formula based on SCr of 0.7 mg/dL). Liver Function Tests: Recent Labs  Lab 01/11/21 1153 01/11/21 1623 01/12/21 0023 01/13/21 0126 01/14/21 0525  AST 211* 184* 147* 106* 81*  ALT 123* 114* 96* 79* 59*  ALKPHOS 84 84 85 81 78  BILITOT 3.5* 3.6* 3.7* 4.5* 2.7*  PROT 7.6 7.6 6.8 7.1 7.0  ALBUMIN 4.0 3.8 3.6 3.7 3.4*   No results for input(s): LIPASE, AMYLASE in the last 168 hours. No results for input(s): AMMONIA in the last 168 hours. Coagulation Profile: Recent Labs  Lab 01/11/21 1633  INR 1.1   Cardiac Enzymes: Recent Labs  Lab 01/11/21 1153  CKTOTAL 221   BNP (last 3 results) No results for input(s): PROBNP in the last 8760 hours. HbA1C: No results for input(s): HGBA1C in the last 72 hours. CBG: No results for input(s): GLUCAP in the last 168 hours. Lipid Profile: No results for input(s): CHOL, HDL, LDLCALC, TRIG, CHOLHDL, LDLDIRECT in the last 72 hours. Thyroid Function Tests: Recent Labs    01/11/21 1623   TSH 2.491   Anemia Panel: No results for input(s): VITAMINB12, FOLATE, FERRITIN, TIBC, IRON, RETICCTPCT in the last 72 hours. Sepsis Labs: No results for input(s): PROCALCITON, LATICACIDVEN in the last 168 hours.  Recent Results (from the past 240 hour(s))  SARS Coronavirus 2 by RT PCR (hospital order, performed in [place] hospital lab)     Status: None   Collection Time: 01/11/21  1:30 PM  Result Value Ref Range Status  SARS Coronavirus 2 NEGATIVE NEGATIVE Final    Comment: (NOTE) SARS-CoV-2 target nucleic acids are NOT DETECTED.  The SARS-CoV-2 RNA is generally detectable in upper and lower respiratory specimens during the acute phase of infection. The lowest concentration of SARS-CoV-2 viral copies this assay can detect is 250 copies / mL. A negative result does not preclude SARS-CoV-2 infection and should not be used as the sole basis for treatment or other patient management decisions.  A negative result may occur with improper specimen collection / handling, submission of specimen other than nasopharyngeal swab, presence of viral mutation(s) within the areas targeted by this assay, and inadequate number of viral copies (<250 copies / mL). A negative result must be combined with clinical observations, patient history, and epidemiological information.  Fact Sheet for Patients:   StrictlyIdeas.no  Fact Sheet for Healthcare Providers: BankingDealers.co.za  This test is not yet approved or  cleared by the [place] FDA and has been authorized for detection and/or diagnosis of SARS-CoV-2 by FDA under an Emergency Use Authorization (EUA).  This EUA will remain in effect (meaning this test can be used) for the duration of the COVID-19 declaration under Section 564(b)(1) of the Act, 21 U.S.C. section 360bbb-3(b)(1), unless the authorization is terminated or revoked sooner.  Performed at [facility], [address], [place], [place] [address]       Radiology Studies: No results found.    Scheduled Meds: . buPROPion  300 mg Oral Daily  . enoxaparin (LOVENOX) injection  60 mg Subcutaneous Q24H  . folic acid  1 mg Oral Daily  . LORazepam  0-4 mg Intravenous Q12H  . metoprolol succinate  50 mg Oral Daily  . multivitamin with minerals  1 tablet Oral Daily  . pantoprazole  40 mg Oral Daily  . thiamine  100 mg Oral Daily   Or  . thiamine  100 mg Intravenous Daily   Continuous Infusions: . sodium chloride 100 mL/hr at 01/14/21 0001     LOS: 3 days      Time spent: 25 minutes   Dessa Phi, DO [facility] 01/14/2021, 12:37 PM   Available via Epic secure chat 7am-7pm After these hours, please refer to coverage provider listed on [email or web address]

## 2021-01-14 NOTE — Progress Notes (Signed)
Due to pt CIWA score of 25 rapid response called.  Given update on pt and results of CIWA and symptom presentation.  Given most recent vital signs.  Is aware of 4mg  ativan given per md order.  Currently pt is resting at this moment.  To note that pt is resting but does have periods of impulsiveness.  Will continue to monitor and  re-evaluate CIWA within the hour.

## 2021-01-14 NOTE — Progress Notes (Signed)
Pt is alseep currently.  Pt has not been able to keep telemetry on.  Will attempt to put telemetry on pt provided that there is no agitation noted.

## 2021-01-14 NOTE — Progress Notes (Signed)
   01/13/21 2226  CIWA-Ar  Nausea and Vomiting 0  Tactile Disturbances 0  Tremor 7  Auditory Disturbances 0  Paroxysmal Sweats 3  Visual Disturbances 0  Anxiety 5  Headache, Fullness in Head 0  Agitation 1  Orientation and Clouding of Sensorium 1  CIWA-Ar Total 17  Pt is impulsive but easily redirected.  Pt is very unsteady on feet.  1-2 staff assistance needed.  Pt is having difficulty with remembering why he is in the hospital and circumstances in how he came to the hospital. Medicated for Ciwa score per md order.

## 2021-01-15 LAB — COMPREHENSIVE METABOLIC PANEL
ALT: 53 U/L — ABNORMAL HIGH (ref 0–44)
AST: 71 U/L — ABNORMAL HIGH (ref 15–41)
Albumin: 3.2 g/dL — ABNORMAL LOW (ref 3.5–5.0)
Alkaline Phosphatase: 76 U/L (ref 38–126)
Anion gap: 9 (ref 5–15)
BUN: 6 mg/dL (ref 6–20)
CO2: 25 mmol/L (ref 22–32)
Calcium: 8.3 mg/dL — ABNORMAL LOW (ref 8.9–10.3)
Chloride: 94 mmol/L — ABNORMAL LOW (ref 98–111)
Creatinine, Ser: 0.55 mg/dL — ABNORMAL LOW (ref 0.61–1.24)
GFR, Estimated: >60 mL/min (ref >60)
Glucose, Bld: 94 mg/dL (ref 70–99)
Potassium: 3.4 mmol/L — ABNORMAL LOW (ref 3.5–5.1)
Sodium: 128 mmol/L — ABNORMAL LOW (ref 135–145)
Total Bilirubin: 2.7 mg/dL — ABNORMAL HIGH (ref 0.3–1.2)
Total Protein: 6.7 g/dL (ref 6.5–8.1)

## 2021-01-15 MED ORDER — POTASSIUM CHLORIDE CRYS ER 20 MEQ PO TBCR
40.0000 meq | EXTENDED_RELEASE_TABLET | Freq: Once | ORAL | Status: AC
  Administered 2021-01-15: 40 meq via ORAL
  Filled 2021-01-15: qty 2

## 2021-01-15 NOTE — Progress Notes (Signed)
   01/15/21 2001  Vitals  BP (!) 157/106  Aware.  No medications indicated at this time. See mar.

## 2021-01-15 NOTE — Progress Notes (Signed)
PROGRESS NOTE    Danny Richard  QIW:[id number] DOB: 1971/03/08 DOA: 01/11/2021 PCP: Aura Dials, MD     Brief Narrative:  Danny Richard is a 50 year old male with past medical history of hypertension, hyperlipidemia, alcohol abuse who presented to [facility] with complaints of fatigue, back pain and decreased urinary frequency.  Additionally, patient states that he tested positive for COVID-19 last Monday.  States that since testing positive he has had decreased p.o. intake and yesterday at work he felt lightheaded and with fogginess.  He also noted decreased urinary output and dark urine and drank Pedialyte with improved urinary output throughout the night however today again at work he noticed similar symptoms which prompted him to come to the ED.  States that since getting IV fluids he has been feeling much better.  Admits to drinking 12 beers daily with his most recent drink Tuesday night.  Patient was noted to have hyponatremia with sodium 115, hypokalemia 2.8.  COVID PCR negative.  Patient was started on IV fluids for hyponatremia and CIWA protocol for alcohol withdrawal.  New events last 24 hours / Subjective: Continues to go through alcohol withdrawal.  Patient admits to drinking at least 12 servings of beer daily and has been doing so for over 5 to 6 years.  Has never been through alcohol withdrawal in the past.  Wants to go home.  CIWA scale has ranged from 3-20.  Patient continues to require IV Ativan.  Assessment & Plan:   Principal Problem:   Hyponatremia Active Problems:   Alcohol abuse   Alcohol withdrawal (HCC)   Hypomagnesemia   Hypokalemia   Elevated LFTs   Hypoosmolar hyponatremia -Likely due to poor p.o. intake, possible contributing from benazepril-HCTZ, question if Wellbutrin as well as alcoholism contributing to his hyponatremia.  His sodium level was as low as 128 back in 2018. -Continue IV fluid  Alcohol abuse at risk of alcohol withdrawal -Continue to monitor  CIWA  Elevated LFTs -Improving -RUQ US shows hepatic steatosis  Hypertension -Continue Toprol, added Norvasc  Recent COVID-19 -Repeat Covid PCR negative.  Can and isolation precaution  Depression/anxiety -Continue Wellbutrin   DVT prophylaxis: Lovenox   Code Status: Full code Family Communication: Spouse at bedside Disposition Plan:  Status is: Inpatient  Remains inpatient appropriate because:Persistent severe electrolyte disturbances and Altered mental status   Dispo: The patient is from: Home              Anticipated d/c is to: Home              Patient currently is not medically stable to d/c.  Remains on IV fluid for hyponatremia. Continue CIWA protocol   Difficult to place patient No      Consultants:   None  Procedures:   None   Antimicrobials:  Anti-infectives (From admission, onward)   None       Objective: Vitals:   01/15/21 0219 01/15/21 0422 01/15/21 0700 01/15/21 1000  BP:  108/89 (!) 140/102 (!) 157/92  Pulse: 95 96 90 90  Resp:  20    Temp:  97.9 F (36.6 C)    TempSrc:  Oral    SpO2:  100%    Weight:      Height:        Intake/Output Summary (Last 24 hours) at 01/15/2021 1046 Last data filed at 01/15/2021 0617 Gross per 24 hour  Intake 1020 ml  Output 650 ml  Net 370 ml   Filed Weights   01/11/21 1105 01/11/21  1700  Weight: 117.9 kg 117.9 kg    Examination: General exam: Appears calm  Respiratory system: Clear to auscultation. Respiratory effort normal. Cardiovascular system: S1 & S2 heard, RRR. No pedal edema. Gastrointestinal system: Abdomen is nondistended, soft and nontender. Normal bowel sounds heard. Central nervous system: Alert and oriented. Non focal exam. + Tremors Extremities: Symmetric in appearance bilaterally  Skin: No rashes, lesions or ulcers on exposed skin  Psychiatry: Mood & affect appropriate.    Data Reviewed: I have personally reviewed following labs and imaging studies  CBC: Recent Labs   Lab 01/11/21 1153 01/11/21 1623 01/12/21 0023  WBC 7.6 6.7 6.3  NEUTROABS 5.5  --   --   HGB 13.9 13.8 12.5*  HCT RESULTS UNAVAILABLE DUE TO INTERFERING SUBSTANCE 37.9* 34.6*  MCV RESULTS UNAVAILABLE DUE TO INTERFERING SUBSTANCE 94.8 97.2  PLT 168 169 458   Basic Metabolic Panel: Recent Labs  Lab 01/11/21 1153 01/11/21 1623 01/11/21 2003 01/13/21 0126 01/13/21 0713 01/13/21 1043 01/13/21 2218 01/14/21 0525 01/14/21 1037 01/15/21 0422  NA 115* 120*   < > 121*  122*   < > 124* 124* 129* 128* 128*  K 2.8* 3.2*   < > 3.8  3.5   < > 3.4* 3.6 3.5 3.6 3.4*  CL 75* 80*   < > 88*  89*   < > 90* 91* 95* 95* 94*  CO2 26 26   < > 24  23   < > 24 23 25 24 25   GLUCOSE 149* 154*   < > 97  100*   < > 133* 115* 104* 102* 94  BUN <5* <5*   < > 7  7   < > 6 7 7  5* 6  CREATININE 0.41* 0.38*   < > 0.57*  0.51*   < > 0.52* 0.55* 0.57* 0.70 0.55*  CALCIUM 9.0 8.9   < > 8.3*  8.5*   < > 8.4* 8.4* 8.3* 8.3* 8.3*  MG 1.3* 1.4*  --  2.0  --   --   --   --   --   --   PHOS  --  2.7  --   --   --   --   --   --   --   --    < > = values in this interval not displayed.   GFR: Estimated Creatinine Clearance: 146.4 mL/min (A) (by C-G formula based on SCr of 0.55 mg/dL (L)). Liver Function Tests: Recent Labs  Lab 01/11/21 1623 01/12/21 0023 01/13/21 0126 01/14/21 0525 01/15/21 0422  AST 184* 147* 106* 81* 71*  ALT 114* 96* 79* 59* 53*  ALKPHOS 84 85 81 78 76  BILITOT 3.6* 3.7* 4.5* 2.7* 2.7*  PROT 7.6 6.8 7.1 7.0 6.7  ALBUMIN 3.8 3.6 3.7 3.4* 3.2*   No results for input(s): LIPASE, AMYLASE in the last 168 hours. No results for input(s): AMMONIA in the last 168 hours. Coagulation Profile: Recent Labs  Lab 01/11/21 1633  INR 1.1   Cardiac Enzymes: Recent Labs  Lab 01/11/21 1153  CKTOTAL 221   BNP (last 3 results) No results for input(s): PROBNP in the last 8760 hours. HbA1C: No results for input(s): HGBA1C in the last 72 hours. CBG: No results for input(s): GLUCAP in  the last 168 hours. Lipid Profile: No results for input(s): CHOL, HDL, LDLCALC, TRIG, CHOLHDL, LDLDIRECT in the last 72 hours. Thyroid Function Tests: No results for input(s): TSH, T4TOTAL, FREET4, T3FREE, THYROIDAB in the  last 72 hours. Anemia Panel: No results for input(s): VITAMINB12, FOLATE, FERRITIN, TIBC, IRON, RETICCTPCT in the last 72 hours. Sepsis Labs: No results for input(s): PROCALCITON, LATICACIDVEN in the last 168 hours.  Recent Results (from the past 240 hour(s))  SARS Coronavirus 2 by RT PCR (hospital order, performed in [place] hospital lab)     Status: None   Collection Time: 01/11/21  1:30 PM  Result Value Ref Range Status   SARS Coronavirus 2 NEGATIVE NEGATIVE Final    Comment: (NOTE) SARS-CoV-2 target nucleic acids are NOT DETECTED.  The SARS-CoV-2 RNA is generally detectable in upper and lower respiratory specimens during the acute phase of infection. The lowest concentration of SARS-CoV-2 viral copies this assay can detect is 250 copies / mL. A negative result does not preclude SARS-CoV-2 infection and should not be used as the sole basis for treatment or other patient management decisions.  A negative result may occur with improper specimen collection / handling, submission of specimen other than nasopharyngeal swab, presence of viral mutation(s) within the areas targeted by this assay, and inadequate number of viral copies (<250 copies / mL). A negative result must be combined with clinical observations, patient history, and epidemiological information.  Fact Sheet for Patients:   StrictlyIdeas.no  Fact Sheet for Healthcare Providers: BankingDealers.co.za  This test is not yet approved or  cleared by the [place] FDA and has been authorized for detection and/or diagnosis of SARS-CoV-2 by FDA under an Emergency Use Authorization (EUA).  This EUA will remain in effect (meaning this test can be used)  for the duration of the COVID-19 declaration under Section 564(b)(1) of the Act, 21 U.S.C. section 360bbb-3(b)(1), unless the authorization is terminated or revoked sooner.  Performed at [facility], [address], [place], [place] [address]       Radiology Studies: No results found.    Scheduled Meds: . amLODipine  5 mg Oral Daily  . buPROPion  300 mg Oral Daily  . enoxaparin (LOVENOX) injection  60 mg Subcutaneous Q24H  . folic acid  1 mg Oral Daily  . metoprolol succinate  50 mg Oral Daily  . multivitamin with minerals  1 tablet Oral Daily  . pantoprazole  40 mg Oral Daily  . potassium chloride  40 mEq Oral Once  . thiamine  100 mg Oral Daily   Or  . thiamine  100 mg Intravenous Daily   Continuous Infusions: . sodium chloride 100 mL/hr at 01/14/21 0001     LOS: 4 days      Time spent: 25 minutes   Dessa Phi, DO [facility] 01/15/2021, 10:46 AM   Available via Epic secure chat 7am-7pm After these hours, please refer to coverage provider listed on [email or web address]

## 2021-01-16 LAB — COMPREHENSIVE METABOLIC PANEL
ALT: 49 U/L — ABNORMAL HIGH (ref 0–44)
AST: 62 U/L — ABNORMAL HIGH (ref 15–41)
Albumin: 3.4 g/dL — ABNORMAL LOW (ref 3.5–5.0)
Alkaline Phosphatase: 78 U/L (ref 38–126)
Anion gap: 10 (ref 5–15)
BUN: <5 mg/dL — ABNORMAL LOW (ref 6–20)
CO2: 26 mmol/L (ref 22–32)
Calcium: 8.8 mg/dL — ABNORMAL LOW (ref 8.9–10.3)
Chloride: 96 mmol/L — ABNORMAL LOW (ref 98–111)
Creatinine, Ser: 0.59 mg/dL — ABNORMAL LOW (ref 0.61–1.24)
GFR, Estimated: >60 mL/min (ref >60)
Glucose, Bld: 107 mg/dL — ABNORMAL HIGH (ref 70–99)
Potassium: 3.4 mmol/L — ABNORMAL LOW (ref 3.5–5.1)
Sodium: 132 mmol/L — ABNORMAL LOW (ref 135–145)
Total Bilirubin: 2.6 mg/dL — ABNORMAL HIGH (ref 0.3–1.2)
Total Protein: 7.4 g/dL (ref 6.5–8.1)

## 2021-01-16 MED ORDER — POTASSIUM CHLORIDE CRYS ER 20 MEQ PO TBCR
40.0000 meq | EXTENDED_RELEASE_TABLET | Freq: Once | ORAL | Status: AC
  Administered 2021-01-16: 40 meq via ORAL
  Filled 2021-01-16: qty 2

## 2021-01-16 MED ORDER — SALINE SPRAY 0.65 % NA SOLN
1.0000 | NASAL | Status: DC | PRN
  Administered 2021-01-16: 1 via NASAL
  Filled 2021-01-16: qty 44

## 2021-01-16 NOTE — Progress Notes (Signed)
PROGRESS NOTE    Danny Richard  NOB:[id number] DOB: [birth year] DOA: 01/11/2021 PCP: Aura Dials, MD     Brief Narrative:  Danny Richard is a 50 year old male with past medical history of hypertension, hyperlipidemia, alcohol abuse who presented to [facility] with complaints of fatigue, back pain and decreased urinary frequency.  Additionally, patient states that he tested positive for COVID-19 last Monday.  States that since testing positive he has had decreased p.o. intake and yesterday at work he felt lightheaded and with fogginess.  He also noted decreased urinary output and dark urine and drank Pedialyte with improved urinary output throughout the night however today again at work he noticed similar symptoms which prompted him to come to the ED.  States that since getting IV fluids he has been feeling much better.  Admits to drinking 12 beers daily with his most recent drink Tuesday night.  Patient was noted to have hyponatremia with sodium 115, hypokalemia 2.8.  COVID PCR negative.  Patient was started on IV fluids for hyponatremia and CIWA protocol for alcohol withdrawal.  New events last 24 hours / Subjective: Patient sitting in a recliner, eager to go home.  However, patient's CIWA score was as high as 17 overnight.  Did not seem to be as agitated overnight.  Still requiring IV Ativan although much less frequently and in decreased dose.  Discussed with wife that patient's alcohol cessation will largely depend on his motivation.  We can provide resources for alcohol cessation counseling.  Assessment & Plan:   Principal Problem:   Hyponatremia Active Problems:   Alcohol abuse   Alcohol withdrawal (HCC)   Hypomagnesemia   Hypokalemia   Elevated LFTs   Hypoosmolar hyponatremia -Likely due to poor p.o. intake, possible contributing from benazepril-HCTZ, question if Wellbutrin as well as alcoholism contributing to his hyponatremia.  His sodium level was as low as 128 back in  2018. -Improving  Alcohol abuse at risk of alcohol withdrawal -Continue to monitor CIWA -TOC consulted for alcohol cessation counseling resources  Elevated LFTs -Improving -RUQ US shows hepatic steatosis  Hypertension -Continue Toprol, added Norvasc  Recent COVID-19 -Repeat Covid PCR negative.  Can and isolation precaution  Depression/anxiety -Continue Wellbutrin   DVT prophylaxis: Lovenox   Code Status: Full code Family Communication: Spouse at bedside Disposition Plan:  Status is: Inpatient  Remains inpatient appropriate because: Alcohol withdrawal   Dispo: The patient is from: Home              Anticipated d/c is to: Home              Patient currently is not medically stable to d/c.  Continue CIWA protocol.  Hopeful discharge home with family 3/1   Difficult to place patient No      Consultants:   None  Procedures:   None   Antimicrobials:  Anti-infectives (From admission, onward)   None       Objective: Vitals:   01/15/21 2001 01/15/21 2212 01/16/21 0509 01/16/21 0917  BP: (!) 157/106  (!) 149/96 (!) 148/109  Pulse: (!) 109 (!) 120 88 (!) 109  Resp: 20  18   Temp: 98.3 F (36.8 C)  99.4 F (37.4 C)   TempSrc: Oral  Oral   SpO2: 97%  98%   Weight:      Height:        Intake/Output Summary (Last 24 hours) at 01/16/2021 1222 Last data filed at 01/16/2021 0900 Gross per 24 hour  Intake 240 ml  Output -  Net 240 ml   Filed Weights   01/11/21 1105 01/11/21 1700  Weight: 117.9 kg 117.9 kg    Examination: General exam: Appears calm and comfortable  Respiratory system: Clear to auscultation. Respiratory effort normal. Cardiovascular system: S1 & S2 heard, RRR. No pedal edema. Gastrointestinal system: Abdomen is nondistended, soft and nontender. Normal bowel sounds heard. Central nervous system: Alert and oriented. Non focal exam. Speech clear. + Tremors Extremities: Symmetric in appearance bilaterally  Skin: No rashes, lesions or  ulcers on exposed skin  Psychiatry: Judgement and insight appear stable. Mood & affect appropriate.    Data Reviewed: I have personally reviewed following labs and imaging studies  CBC: Recent Labs  Lab 01/11/21 1153 01/11/21 1623 01/12/21 0023  WBC 7.6 6.7 6.3  NEUTROABS 5.5  --   --   HGB 13.9 13.8 12.5*  HCT RESULTS UNAVAILABLE DUE TO INTERFERING SUBSTANCE 37.9* 34.6*  MCV RESULTS UNAVAILABLE DUE TO INTERFERING SUBSTANCE 94.8 97.2  PLT 168 169 417   Basic Metabolic Panel: Recent Labs  Lab 01/11/21 1153 01/11/21 1623 01/11/21 2003 01/13/21 0126 01/13/21 0713 01/13/21 2218 01/14/21 0525 01/14/21 1037 01/15/21 0422 01/16/21 0408  NA 115* 120*   < > 121*  122*   < > 124* 129* 128* 128* 132*  K 2.8* 3.2*   < > 3.8  3.5   < > 3.6 3.5 3.6 3.4* 3.4*  CL 75* 80*   < > 88*  89*   < > 91* 95* 95* 94* 96*  CO2 26 26   < > 24  23   < > 23 25 24 25 26   GLUCOSE 149* 154*   < > 97  100*   < > 115* 104* 102* 94 107*  BUN <5* <5*   < > 7  7   < > 7 7 5* 6 <5*  CREATININE 0.41* 0.38*   < > 0.57*  0.51*   < > 0.55* 0.57* 0.70 0.55* 0.59*  CALCIUM 9.0 8.9   < > 8.3*  8.5*   < > 8.4* 8.3* 8.3* 8.3* 8.8*  MG 1.3* 1.4*  --  2.0  --   --   --   --   --   --   PHOS  --  2.7  --   --   --   --   --   --   --   --    < > = values in this interval not displayed.   GFR: Estimated Creatinine Clearance: 146.4 mL/min (A) (by C-G formula based on SCr of 0.59 mg/dL (L)). Liver Function Tests: Recent Labs  Lab 01/12/21 0023 01/13/21 0126 01/14/21 0525 01/15/21 0422 01/16/21 0408  AST 147* 106* 81* 71* 62*  ALT 96* 79* 59* 53* 49*  ALKPHOS 85 81 78 76 78  BILITOT 3.7* 4.5* 2.7* 2.7* 2.6*  PROT 6.8 7.1 7.0 6.7 7.4  ALBUMIN 3.6 3.7 3.4* 3.2* 3.4*   No results for input(s): LIPASE, AMYLASE in the last 168 hours. No results for input(s): AMMONIA in the last 168 hours. Coagulation Profile: Recent Labs  Lab 01/11/21 1633  INR 1.1   Cardiac Enzymes: Recent Labs  Lab  01/11/21 1153  CKTOTAL 221   BNP (last 3 results) No results for input(s): PROBNP in the last 8760 hours. HbA1C: No results for input(s): HGBA1C in the last 72 hours. CBG: No results for input(s): GLUCAP in the last 168 hours. Lipid Profile: No results for input(s): CHOL, HDL, LDLCALC, TRIG,  CHOLHDL, LDLDIRECT in the last 72 hours. Thyroid Function Tests: No results for input(s): TSH, T4TOTAL, FREET4, T3FREE, THYROIDAB in the last 72 hours. Anemia Panel: No results for input(s): VITAMINB12, FOLATE, FERRITIN, TIBC, IRON, RETICCTPCT in the last 72 hours. Sepsis Labs: No results for input(s): PROCALCITON, LATICACIDVEN in the last 168 hours.  Recent Results (from the past 240 hour(s))  SARS Coronavirus 2 by RT PCR (hospital order, performed in [place] hospital lab)     Status: None   Collection Time: 01/11/21  1:30 PM  Result Value Ref Range Status   SARS Coronavirus 2 NEGATIVE NEGATIVE Final    Comment: (NOTE) SARS-CoV-2 target nucleic acids are NOT DETECTED.  The SARS-CoV-2 RNA is generally detectable in upper and lower respiratory specimens during the acute phase of infection. The lowest concentration of SARS-CoV-2 viral copies this assay can detect is 250 copies / mL. A negative result does not preclude SARS-CoV-2 infection and should not be used as the sole basis for treatment or other patient management decisions.  A negative result may occur with improper specimen collection / handling, submission of specimen other than nasopharyngeal swab, presence of viral mutation(s) within the areas targeted by this assay, and inadequate number of viral copies (<250 copies / mL). A negative result must be combined with clinical observations, patient history, and epidemiological information.  Fact Sheet for Patients:   StrictlyIdeas.no  Fact Sheet for Healthcare Providers: BankingDealers.co.za  This test is not yet approved or   cleared by the [place] FDA and has been authorized for detection and/or diagnosis of SARS-CoV-2 by FDA under an Emergency Use Authorization (EUA).  This EUA will remain in effect (meaning this test can be used) for the duration of the COVID-19 declaration under Section 564(b)(1) of the Act, 21 U.S.C. section 360bbb-3(b)(1), unless the authorization is terminated or revoked sooner.  Performed at [facility], [address], [place], [place] [address]       Radiology Studies: No results found.    Scheduled Meds: . amLODipine  5 mg Oral Daily  . buPROPion  300 mg Oral Daily  . enoxaparin (LOVENOX) injection  60 mg Subcutaneous Q24H  . folic acid  1 mg Oral Daily  . metoprolol succinate  50 mg Oral Daily  . multivitamin with minerals  1 tablet Oral Daily  . pantoprazole  40 mg Oral Daily  . thiamine  100 mg Oral Daily   Or  . thiamine  100 mg Intravenous Daily   Continuous Infusions: . sodium chloride 100 mL/hr at 01/16/21 1018     LOS: 5 days      Time spent: 25 minutes   Dessa Phi, DO [facility] 01/16/2021, 12:22 PM   Available via Epic secure chat 7am-7pm After these hours, please refer to coverage provider listed on [email or web address]

## 2021-01-16 NOTE — TOC Progression Note (Signed)
Transition of Care [facility]) - Progression Note    Patient Details  Name: Danny Richard MRN: [id number] Date of Birth: 12/18/1970  Transition of Care [facility]) CM/SW Contact  Purcell Mouton, RN Phone Number: 01/16/2021, 3:24 PM  Clinical Narrative:     Resources on Alcohol Cessation given to pt.   Expected Discharge Plan: Home/Self Care Barriers to Discharge: No Barriers Identified  Expected Discharge Plan and Services Expected Discharge Plan: Home/Self Care       Living arrangements for the past 2 months: Single Family Home                                       Social Determinants of Health (SDOH) Interventions    Readmission Risk Interventions No flowsheet data found.

## 2021-01-16 NOTE — Plan of Care (Signed)
  Problem: Clinical Measurements: Goal: Ability to maintain clinical measurements within normal limits will improve Outcome: Progressing Goal: Will remain free from infection Outcome: Progressing Goal: Diagnostic test results will improve Outcome: Progressing Goal: Respiratory complications will improve Outcome: Progressing Goal: Cardiovascular complication will be avoided Outcome: Progressing   Problem: Nutrition: Goal: Adequate nutrition will be maintained Outcome: Progressing   Problem: Coping: Goal: Level of anxiety will decrease Outcome: Progressing   Problem: Elimination: Goal: Will not experience complications related to bowel motility Outcome: Progressing Goal: Will not experience complications related to urinary retention Outcome: Progressing   Problem: Pain Managment: Goal: General experience of comfort will improve Outcome: Progressing   Problem: Safety: Goal: Ability to remain free from injury will improve Outcome: Progressing   Problem: Skin Integrity: Goal: Risk for impaired skin integrity will decrease Outcome: Progressing   Problem: Physical Regulation: Goal: Complications related to the disease process, condition or treatment will be avoided or minimized Outcome: Progressing   Problem: Safety: Goal: Ability to remain free from injury will improve Outcome: Progressing

## 2021-01-17 LAB — COMPREHENSIVE METABOLIC PANEL
ALT: 43 U/L (ref 0–44)
AST: 59 U/L — ABNORMAL HIGH (ref 15–41)
Albumin: 3.4 g/dL — ABNORMAL LOW (ref 3.5–5.0)
Alkaline Phosphatase: 83 U/L (ref 38–126)
Anion gap: 11 (ref 5–15)
BUN: 6 mg/dL (ref 6–20)
CO2: 24 mmol/L (ref 22–32)
Calcium: 8.7 mg/dL — ABNORMAL LOW (ref 8.9–10.3)
Chloride: 96 mmol/L — ABNORMAL LOW (ref 98–111)
Creatinine, Ser: 0.71 mg/dL (ref 0.61–1.24)
GFR, Estimated: >60 mL/min (ref >60)
Glucose, Bld: 99 mg/dL (ref 70–99)
Potassium: 3.7 mmol/L (ref 3.5–5.1)
Sodium: 131 mmol/L — ABNORMAL LOW (ref 135–145)
Total Bilirubin: 1.9 mg/dL — ABNORMAL HIGH (ref 0.3–1.2)
Total Protein: 7.3 g/dL (ref 6.5–8.1)

## 2021-01-17 LAB — MAGNESIUM: Magnesium: 2.2 mg/dL (ref 1.7–2.4)

## 2021-01-17 MED ORDER — AMLODIPINE BESYLATE 5 MG PO TABS
5.0000 mg | ORAL_TABLET | Freq: Every day | ORAL | 1 refills | Status: AC
Start: 2021-01-18 — End: ?

## 2021-01-17 MED ORDER — CHLORDIAZEPOXIDE HCL 5 MG PO CAPS: ORAL_CAPSULE | ORAL | 0 refills | Status: DC

## 2021-01-17 NOTE — Progress Notes (Signed)
Discharge packet reviewed with pt and wife. All questions answered. Pt and wife verbalize understanding of instructions. IV taken out.

## 2021-01-17 NOTE — Progress Notes (Signed)
PT Cancellation Note  Patient Details Name: Danny Richard MRN: [id number] DOB: 04-05-1971   Cancelled Treatment:    Reason Eval/Treat Not Completed: Fatigue/lethargy limiting ability to participate (pt is sleeping soundly, did not respond to verbal stimulii. Pt's spouse requested pt not be disturbed, she stated he recently had an ativan which made hime tired. Will follow.)   Philomena Doheny PT 01/17/2021  Acute Rehabilitation Services Pager [phone] Office [phone]

## 2021-01-17 NOTE — Discharge Instructions (Signed)
Alcohol Withdrawal Syndrome Alcohol withdrawal syndrome is a group of symptoms that can develop when a person who drinks heavily and regularly stops drinking or drinks less. Alcohol withdrawal syndrome can be mild or severe, and it may even be life-threatening. Alcohol withdrawal syndrome usually affects people who have alcohol use disorder, which may also be called alcoholism. Alcohol use disorder is when a person is unable to control his or her alcohol use, and drinking too much or too often causes problems at home, at work, or in relationships. What are the causes? Drinking heavily and drinking on a regular basis cause changes in brain chemistry. Over time, the body becomes dependent on alcohol. When alcohol use stops, the chemistry system in the brain becomes unbalanced and causes the symptoms of alcohol withdrawal. What increases the risk? Alcohol withdrawal syndrome is more likely to occur in people who drink more than the recommended limit of alcohol (2 drinks a day for men or 1 drink a day for non-pregnant women). It is also more likely to affect heavy drinkers who have been using alcohol for long periods of time. The more a person drinks and the longer he or she drinks, the greater the risk of alcohol withdrawal syndrome. Severe withdrawal is more likely to develop in someone who:  Had severe alcohol withdrawal in the past.  Had a seizure during a previous episode of alcohol withdrawal.  Is elderly.  Uses other drugs.  Has a long-term (chronic) medical problem, such as heart, lung, or liver disease.  Has depression.  Does not get enough nutrients from his or her diet (malnutrition). What are the signs or symptoms? Symptoms of this condition can be mild to moderate, or they can be severe. Symptoms may develop a few hours (or up to a day) after a person changes his or her drinking patterns. During the 48 hours after he or she has stopped drinking, the following symptoms may go away or  get better:  Uncontrollable shaking (tremor).  Sweating.  Headache.  Anxiety.  Inability to relax (agitation).  Trouble sleeping (insomnia).  Irregular heartbeats (palpitations).  Alcohol cravings.  Seizure. The following symptoms may get worse 24-48 hours after a person has decreased or stopped alcohol use, and they may gradually improve over a period of days or weeks:  Nausea and vomiting.  Fatigue.  Sensitivity to light and sounds.  Confusion and inability to think clearly.  Loss of appetite.  Mood swings, irritability, depression, and anxiety.  Insomnia and nightmares. The following symptoms are severe and life-threatening. When these symptoms occur together, they are called delirium tremens (DTs):  High blood pressure.  Increased heart rate.  Trouble breathing.  Seizures. These may go away along with other symptoms, or they may persist.  Seeing, hearing, feeling, smelling, or tasting things that are not there (hallucinations). If you experience hallucinations, they usually begin 12-24 hours after a change in drinking patterns. Delirium tremens requires immediate hospitalization. How is this diagnosed? This condition may be diagnosed based on:  Your symptoms and medical history.  Your history of alcohol use. Your health care provider may ask questions about your drinking behavior. It is important to be honest when you answer these questions.  A psychological assessment.  A physical exam.  Blood tests or urine tests to measure blood alcohol level and to rule out other causes of symptoms.  MRI or CT scan. This may be done if you seem to have abnormal thinking or behaviors (altered mental status). Diagnosis can be difficult.  People going through withdrawal often avoid seeking medical care and are not thinking clearly. Friends and family members play an important role in recognizing symptoms and encouraging loved ones to get treatment. How is this  treated? Most people with symptoms of withdrawal can be treated outside of a hospital setting (outpatient treatment), with close monitoring such as daily check-ins with a health care provider and counseling. You may need treatment at a hospital or treatment center (inpatient treatment) if:  You have a history of delirium tremens or seizures.  You have severe symptoms.  You are addicted to other drugs.  You cannot swallow medicine.  You have a serious medical condition such as heart failure.  You experienced withdrawal in the past but then you continued drinking alcohol.  You are not likely to commit to an outpatient treatment schedule. Treatment may involve:  Monitoring your blood pressure, pulse, and breathing.  IV fluids to keep you hydrated.  Medicines to reduce withdrawal symptoms and discomfort (benzodiazepines).  Medicine to reduce anxiety.  Medicine to prevent or control seizures.  Multivitamins and B vitamins.  Having a health care provider check on you daily. It is important to get treatment for alcohol withdrawal early. Getting treatment early can:  Speed up your recovery from withdrawal symptoms.  Make you more successful with long-term stoppage of alcohol use (sobriety). If you need help to stop drinking, your health care provider may recommend a long-term treatment plan that includes:  Medicines to help treat alcohol use disorder.  Substance abuse counseling.  Support groups. Follow these instructions at home:  Take over-the-counter and prescription medicines (including vitamin supplements) only as told by your health care provider.  Do not drink alcohol.  Do not drive until your health care provider approves.  Have someone you trust stay with you or be available if you need help with your symptoms or with not drinking.  Drink enough fluid to keep your urine pale yellow.  Consider joining an alcohol support group or treatment program. These can  provide emotional support, advice, and guidance.  Keep all follow-up visits as told by your health care provider. This is important.   Contact a health care provider if:  Your symptoms get worse instead of better.  You cannot eat or drink without vomiting.  You are struggling with not drinking alcohol.  You cannot stop drinking alcohol. Get help right away if:  You have an irregular heartbeat.  You have chest pain.  You have trouble breathing.  You have a seizure for the first time.  You hallucinate.  You become very confused. Summary  Alcohol withdrawal is a group of symptoms that can develop when a person who drinks heavily and regularly stops drinking or drinks less.  Symptoms of this condition can be mild to moderate, or they can be severe.  Treatment may include hospitalization, medicine, and counseling. This information is not intended to replace advice given to you by your health care provider. Make sure you discuss any questions you have with your health care provider. Document Revised: 10/18/2017 Document Reviewed: 07/12/2017 Elsevier Patient Education  2021 [employer].     Hyponatremia Hyponatremia is when the amount of salt (sodium) in your blood is too low. When sodium levels are low, your cells absorb extra water, which causes them to swell. The swelling happens throughout the body, but it mostly affects the brain. What are the causes? This condition may be caused by:  Certain medical conditions, such as: ? Heart, kidney, or  liver problems. ? Thyroid problems. ? Adrenal gland problems. ? Metabolic conditions, such as Addison disease or syndrome of inappropriate antidiuretic hormone (SIADH).  Severe vomiting or diarrhea.  Certain medicines or illegal drugs.  Dehydration.  Drinking too much water.  Eating a diet that is low in sodium.  Large burns on your body.  Excessive sweating. What increases the risk? You are more likely to develop  this condition if you:  Have long-term (chronic) kidney disease.  Have heart failure.  Have a medical condition that causes frequent or excessive diarrhea.  Participate in intense physical activities, such as marathon running.  Take certain medicines that affect the sodium and fluid balance in the blood. Some of these medicine types include: ? Diuretics. ? NSAIDs. ? Some opioid pain medicines. ? Some antidepressants. ? Some seizure prevention medicines. What are the signs or symptoms? Symptoms of this condition include:  Headache.  Nausea and vomiting.  Being very tired (lethargic).  Muscle weakness and cramping.  Loss of appetite.  Feeling weak or light-headed. Severe symptoms of this condition include:  Confusion.  Agitation.  Having a rapid heart rate.  Passing out (fainting).  Seizures.  Coma. How is this diagnosed? This condition is diagnosed based on:  A physical exam.  Your medical history.  Tests, including: ? Blood tests. ? Urine tests. How is this treated? Treatment for this condition depends on the cause. Treatment may include:  Getting fluids through an IV that is inserted into one of your veins.  Medicines to correct the sodium imbalance. If medicines are causing the condition, the medicines will need to be adjusted.  Limiting your water or fluid intake to get the correct sodium balance.  Monitoring in the hospital setting to closely watch your symptoms for improvement.   Follow these instructions at home:  Take over-the-counter and prescription medicines only as told by your health care provider. Many medicines can make this condition worse. Talk with your health care provider about any medicines that you are currently taking.  Carefully follow a recommended diet as told by your health care provider.  Carefully follow instructions from your health care provider about fluid restrictions.  Do not drink alcohol.  Keep all follow-up  visits as told by your health care provider. This is important.   Contact a health care provider if:  You develop worsening nausea, fatigue, headache, confusion, or weakness.  Your symptoms go away and then return.  You have problems following the recommended diet. Get help right away if:  You have a seizure.  You pass out.  You have ongoing diarrhea or vomiting. Summary  Hyponatremia is when the amount of salt (sodium) in your blood is too low.  When sodium levels are low, your cells absorb extra water, which causes them to swell.  The swelling happens throughout the body, but it mostly affects the brain.  Treatment for this condition depends on the cause. It may include IV fluids, medicines, and limiting your fluid intake. This information is not intended to replace advice given to you by your health care provider. Make sure you discuss any questions you have with your health care provider. Document Revised: 09/19/2018 Document Reviewed: 09/19/2018 Elsevier Patient Education  2021 [employer].

## 2021-01-17 NOTE — Progress Notes (Signed)
PROGRESS NOTE    Danny Richard  RPR:[id number] DOB: 1971-03-06 DOA: 01/11/2021 PCP: Aura Dials, MD     Brief Narrative:  Danny Richard is a 50 year old male with past medical history of hypertension, hyperlipidemia, alcohol abuse who presented to [facility] with complaints of fatigue, back pain and decreased urinary frequency.  Additionally, patient states that he tested positive for COVID-19 last Monday.  States that since testing positive he has had decreased p.o. intake and yesterday at work he felt lightheaded and with fogginess.  He also noted decreased urinary output and dark urine and drank Pedialyte with improved urinary output throughout the night however today again at work he noticed similar symptoms which prompted him to come to the ED.  States that since getting IV fluids he has been feeling much better.  Admits to drinking 12 beers daily with his most recent drink Tuesday night.  Patient was noted to have hyponatremia with sodium 115, hypokalemia 2.8.  COVID PCR negative.  Patient was started on IV fluids for hyponatremia and CIWA protocol for alcohol withdrawal.  New events last 24 hours / Subjective: Patient wishing to go home. He is sitting in the recliner. He is still requiring IV ativan for alcohol withdrawal, although his symptoms are much improved from over the weekend. Wife at bedside a little hesitant about him going home and being steady on his feet.  We will ask PT to evaluate patient  Assessment & Plan:   Principal Problem:   Hyponatremia Active Problems:   Alcohol abuse   Alcohol withdrawal (HCC)   Hypomagnesemia   Hypokalemia   Elevated LFTs   Hypoosmolar hyponatremia -Likely due to poor p.o. intake, possible contributing from benazepril-HCTZ, question if Wellbutrin as well as alcoholism contributing to his hyponatremia.  His sodium level was as low as 128 back in 2018. -Improved   Alcohol abuse at risk of alcohol withdrawal -Continue to monitor CIWA, still  requiring IV ativan today  -TOC provided alcohol cessation counseling resources  Elevated LFTs -RUQ US shows hepatic steatosis -Improving  Hypertension -Continue Toprol, added Norvasc  Recent COVID-19 -Repeat Covid PCR negative.  Can and isolation precaution  Depression/anxiety -Continue Wellbutrin   DVT prophylaxis: Lovenox   Code Status: Full code Family Communication: Spouse at bedside Disposition Plan:  Status is: Inpatient  Remains inpatient appropriate because: Alcohol withdrawal   Dispo: The patient is from: Home              Anticipated d/c is to: Home              Patient currently is not medically stable to d/c.  Continue CIWA protocol.  Hopeful discharge home with family 3/2   Difficult to place patient No      Consultants:   None  Procedures:   None   Antimicrobials:  Anti-infectives (From admission, onward)   None       Objective: Vitals:   01/16/21 2033 01/17/21 0620 01/17/21 1025 01/17/21 1209  BP: (!) 151/102 (!) 147/96 (!) 136/98 (!) 138/92  Pulse: 78 74 97 73  Resp: 18 18  (!) 22  Temp:  (!) 97.5 F (36.4 C)  98.2 F (36.8 C)  TempSrc:  Oral  Oral  SpO2: 96% 97%  97%  Weight:      Height:        Intake/Output Summary (Last 24 hours) at 01/17/2021 1230 Last data filed at 01/17/2021 1000 Gross per 24 hour  Intake 460 ml  Output 750 ml  Net -290  ml   Filed Weights   01/11/21 1105 01/11/21 1700  Weight: 117.9 kg 117.9 kg    Examination: General exam: Appears calm and comfortable  Respiratory system: Clear to auscultation. Respiratory effort normal. Cardiovascular system: S1 & S2 heard, RRR. No pedal edema. Gastrointestinal system: Abdomen is nondistended, soft and nontender. Normal bowel sounds heard. Central nervous system: Alert and oriented. Non focal exam. Speech clear. +mild tremors  Extremities: Symmetric in appearance bilaterally  Skin: No rashes, lesions or ulcers on exposed skin  Psychiatry: Judgement and  insight appear stable. Mood & affect appropriate.    Data Reviewed: I have personally reviewed following labs and imaging studies  CBC: Recent Labs  Lab 01/11/21 1153 01/11/21 1623 01/12/21 0023  WBC 7.6 6.7 6.3  NEUTROABS 5.5  --   --   HGB 13.9 13.8 12.5*  HCT RESULTS UNAVAILABLE DUE TO INTERFERING SUBSTANCE 37.9* 34.6*  MCV RESULTS UNAVAILABLE DUE TO INTERFERING SUBSTANCE 94.8 97.2  PLT 168 169 193   Basic Metabolic Panel: Recent Labs  Lab 01/11/21 1153 01/11/21 1623 01/11/21 2003 01/13/21 0126 01/13/21 0713 01/14/21 0525 01/14/21 1037 01/15/21 0422 01/16/21 0408 01/17/21 0400  NA 115* 120*   < > 121*  122*   < > 129* 128* 128* 132* 131*  K 2.8* 3.2*   < > 3.8  3.5   < > 3.5 3.6 3.4* 3.4* 3.7  CL 75* 80*   < > 88*  89*   < > 95* 95* 94* 96* 96*  CO2 26 26   < > 24  23   < > 25 24 25 26 24   GLUCOSE 149* 154*   < > 97  100*   < > 104* 102* 94 107* 99  BUN <5* <5*   < > 7  7   < > 7 5* 6 <5* 6  CREATININE 0.41* 0.38*   < > 0.57*  0.51*   < > 0.57* 0.70 0.55* 0.59* 0.71  CALCIUM 9.0 8.9   < > 8.3*  8.5*   < > 8.3* 8.3* 8.3* 8.8* 8.7*  MG 1.3* 1.4*  --  2.0  --   --   --   --   --  2.2  PHOS  --  2.7  --   --   --   --   --   --   --   --    < > = values in this interval not displayed.   GFR: Estimated Creatinine Clearance: 146.4 mL/min (by C-G formula based on SCr of 0.71 mg/dL). Liver Function Tests: Recent Labs  Lab 01/13/21 0126 01/14/21 0525 01/15/21 0422 01/16/21 0408 01/17/21 0400  AST 106* 81* 71* 62* 59*  ALT 79* 59* 53* 49* 43  ALKPHOS 81 78 76 78 83  BILITOT 4.5* 2.7* 2.7* 2.6* 1.9*  PROT 7.1 7.0 6.7 7.4 7.3  ALBUMIN 3.7 3.4* 3.2* 3.4* 3.4*   No results for input(s): LIPASE, AMYLASE in the last 168 hours. No results for input(s): AMMONIA in the last 168 hours. Coagulation Profile: Recent Labs  Lab 01/11/21 1633  INR 1.1   Cardiac Enzymes: Recent Labs  Lab 01/11/21 1153  CKTOTAL 221   BNP (last 3 results) No results for  input(s): PROBNP in the last 8760 hours. HbA1C: No results for input(s): HGBA1C in the last 72 hours. CBG: No results for input(s): GLUCAP in the last 168 hours. Lipid Profile: No results for input(s): CHOL, HDL, LDLCALC, TRIG, CHOLHDL, LDLDIRECT in the last  72 hours. Thyroid Function Tests: No results for input(s): TSH, T4TOTAL, FREET4, T3FREE, THYROIDAB in the last 72 hours. Anemia Panel: No results for input(s): VITAMINB12, FOLATE, FERRITIN, TIBC, IRON, RETICCTPCT in the last 72 hours. Sepsis Labs: No results for input(s): PROCALCITON, LATICACIDVEN in the last 168 hours.  Recent Results (from the past 240 hour(s))  SARS Coronavirus 2 by RT PCR (hospital order, performed in [place] hospital lab)     Status: None   Collection Time: 01/11/21  1:30 PM  Result Value Ref Range Status   SARS Coronavirus 2 NEGATIVE NEGATIVE Final    Comment: (NOTE) SARS-CoV-2 target nucleic acids are NOT DETECTED.  The SARS-CoV-2 RNA is generally detectable in upper and lower respiratory specimens during the acute phase of infection. The lowest concentration of SARS-CoV-2 viral copies this assay can detect is 250 copies / mL. A negative result does not preclude SARS-CoV-2 infection and should not be used as the sole basis for treatment or other patient management decisions.  A negative result may occur with improper specimen collection / handling, submission of specimen other than nasopharyngeal swab, presence of viral mutation(s) within the areas targeted by this assay, and inadequate number of viral copies (<250 copies / mL). A negative result must be combined with clinical observations, patient history, and epidemiological information.  Fact Sheet for Patients:   StrictlyIdeas.no  Fact Sheet for Healthcare Providers: BankingDealers.co.za  This test is not yet approved or  cleared by the [place] FDA and has been authorized for detection  and/or diagnosis of SARS-CoV-2 by FDA under an Emergency Use Authorization (EUA).  This EUA will remain in effect (meaning this test can be used) for the duration of the COVID-19 declaration under Section 564(b)(1) of the Act, 21 U.S.C. section 360bbb-3(b)(1), unless the authorization is terminated or revoked sooner.  Performed at [facility], [address] [place], [place] [address]       Radiology Studies: No results found.    Scheduled Meds: . amLODipine  5 mg Oral Daily  . buPROPion  300 mg Oral Daily  . enoxaparin (LOVENOX) injection  60 mg Subcutaneous Q24H  . folic acid  1 mg Oral Daily  . metoprolol succinate  50 mg Oral Daily  . multivitamin with minerals  1 tablet Oral Daily  . pantoprazole  40 mg Oral Daily  . thiamine  100 mg Oral Daily   Or  . thiamine  100 mg Intravenous Daily   Continuous Infusions:    LOS: 6 days      Time spent: 20 minutes   Dessa Phi, DO [facility] 01/17/2021, 12:30 PM   Available via Epic secure chat 7am-7pm After these hours, please refer to coverage provider listed on [email or web address]

## 2021-01-17 NOTE — Plan of Care (Signed)
  Problem: Health Behavior/Discharge Planning: Goal: Ability to manage health-related needs will improve Outcome: Progressing   Problem: Clinical Measurements: Goal: Ability to maintain clinical measurements within normal limits will improve Outcome: Progressing Goal: Will remain free from infection Outcome: Progressing Goal: Diagnostic test results will improve Outcome: Progressing Goal: Respiratory complications will improve Outcome: Progressing Goal: Cardiovascular complication will be avoided Outcome: Progressing   Problem: Activity: Goal: Risk for activity intolerance will decrease Outcome: Progressing   Problem: Nutrition: Goal: Adequate nutrition will be maintained Outcome: Progressing   Problem: Coping: Goal: Level of anxiety will decrease Outcome: Progressing   Problem: Elimination: Goal: Will not experience complications related to bowel motility Outcome: Progressing Goal: Will not experience complications related to urinary retention Outcome: Progressing   Problem: Pain Managment: Goal: General experience of comfort will improve Outcome: Progressing   Problem: Safety: Goal: Ability to remain free from injury will improve Outcome: Progressing   Problem: Skin Integrity: Goal: Risk for impaired skin integrity will decrease Outcome: Progressing   Problem: Education: Goal: Knowledge of disease or condition will improve Outcome: Progressing Goal: Understanding of discharge needs will improve Outcome: Progressing   Problem: Physical Regulation: Goal: Complications related to the disease process, condition or treatment will be avoided or minimized Outcome: Progressing   Problem: Safety: Goal: Ability to remain free from injury will improve Outcome: Progressing

## 2021-01-17 NOTE — Evaluation (Signed)
Physical Therapy Evaluation Patient Details Name: Danny Richard MRN: [id number] DOB: Feb 27, 1971 Today's Date: 01/17/2021   History of Present Illness  50 year old male with past medical history of hypertension, hyperlipidemia, alcohol abuse who presented to [facility] with complaints of fatigue, back pain and decreased urinary frequency.  Additionally, patient states that he tested positive for COVID-19 last Monday. PCR now negative. Dx of hyponatremia.  Clinical Impression  Pt admitted with above diagnosis. Pt ambulated 43' with RW with min/guard assist for balance. Verbal cues to increase step length as pt has shuffling gait. Encouraged pt to use RW at home to minimize fall risk. From PT standpoint, he is ready to DC home.  Pt currently with functional limitations due to the deficits listed below (see PT Problem List). Pt will benefit from skilled PT to increase their independence and safety with mobility to allow discharge to the venue listed below.       Follow Up Recommendations No PT follow up    Equipment Recommendations  None recommended by PT    Recommendations for Other Services       Precautions / Restrictions Precautions Precautions: Fall Precaution Comments: pt denies falls Restrictions Weight Bearing Restrictions: No      Mobility  Bed Mobility               General bed mobility comments: sitting at  EOB    Transfers Overall transfer level: Needs assistance Equipment used: Rolling walker (2 wheeled) Transfers: Sit to/from Stand Sit to Stand: Min guard         General transfer comment: VCs hand placement  Ambulation/Gait Ambulation/Gait assistance: Min guard Gait Distance (Feet): 90 Feet Assistive device: Rolling walker (2 wheeled) Gait Pattern/deviations: Step-through pattern;Decreased step length - right;Decreased step length - left;Shuffle Gait velocity: decr   General Gait Details: VCs to increase step length and to step closer to RW, min guard 2* pt  mildly unsteady  Stairs            Wheelchair Mobility    Modified Rankin (Stroke Patients Only)       Balance Overall balance assessment: Needs assistance   Sitting balance-Leahy Scale: Good       Standing balance-Leahy Scale: Fair                               Pertinent Vitals/Pain Pain Assessment: No/denies pain    Home Living Family/patient expects to be discharged to:: Private residence Living Arrangements: Spouse/significant other Available Help at Discharge: Family;Available 24 hours/day Type of Home: House Home Access: Stairs to enter   [employer] of Steps: 2 Home Layout: Two level;Able to live on main level with bedroom/bathroom Home Equipment: Gilford Rile - 2 wheels;Walker - 4 wheels      Prior Function Level of Independence: Independent         Comments: per wife pt walked without AD, wife stated pt has not had falls in past 1 year     Hand Dominance        Extremity/Trunk Assessment   Upper Extremity Assessment Upper Extremity Assessment: Overall WFL for tasks assessed    Lower Extremity Assessment Lower Extremity Assessment: Overall WFL for tasks assessed    Cervical / Trunk Assessment Cervical / Trunk Assessment: Normal  Communication   Communication: No difficulties  Cognition Arousal/Alertness: Awake/alert Behavior During Therapy: WFL for tasks assessed/performed Overall Cognitive Status: Within Functional Limits for tasks assessed  General Comments      Exercises     Assessment/Plan    PT Assessment Patient needs continued PT services  PT Problem List Decreased balance;Decreased activity tolerance       PT Treatment Interventions Gait training;Therapeutic activities;Therapeutic exercise;Balance training    PT Goals (Current goals can be found in the Care Plan section)  Acute Rehab PT Goals Patient Stated Goal: improve balance PT Goal  Formulation: With patient/family Time For Goal Achievement: 01/31/21 Potential to Achieve Goals: Fair    Frequency Min 3X/week   Barriers to discharge        Co-evaluation               AM-PAC PT "6 Clicks" Mobility  Outcome Measure Help needed turning from your back to your side while in a flat bed without using bedrails?: None Help needed moving from lying on your back to sitting on the side of a flat bed without using bedrails?: None Help needed moving to and from a bed to a chair (including a wheelchair)?: None Help needed standing up from a chair using your arms (e.g., wheelchair or bedside chair)?: A Little Help needed to walk in hospital room?: A Little Help needed climbing 3-5 steps with a railing? : A Little 6 Click Score: 21    End of Session Equipment Utilized During Treatment: Gait belt Activity Tolerance: Patient tolerated treatment well Patient left: in bed;with call bell/phone within reach;with family/visitor present Nurse Communication: Mobility status PT Visit Diagnosis: Unsteadiness on feet (R26.81);Difficulty in walking, not elsewhere classified (R26.2)    Time: 1441-1500 PT Time Calculation (min) (ACUTE ONLY): 19 min   Charges:   PT Evaluation $PT Eval Low Complexity: 1 Low         Blondell Reveal Kistler PT 01/17/2021  Acute Rehabilitation Services Pager [phone] Office [phone]

## 2021-01-17 NOTE — Discharge Summary (Signed)
Physician Discharge Summary  Danny Richard XTG:[id number] DOB: [birth year] DOA: 01/11/2021  PCP: Aura Dials, MD  Admit date: 01/11/2021 Discharge date: 01/17/2021  Admitted From: Home Disposition:  Home  Recommendations for Outpatient Follow-up:  1. Follow up with PCP in 1 week  2. Repeat BMP in 1 week to check sodium level  3. Refrain from alcohol use   Discharge Condition: Stable CODE STATUS: Full  Diet recommendation: Regular diet   Brief/Interim Summary: Danny Richard is a 50 year old male with past medical history of hypertension, hyperlipidemia, alcohol abuse who presented to [facility] with complaints of fatigue, back pain and decreased urinary frequency. Additionally, patient states that he tested positive for COVID-19 last Monday. States that since testing positive he has had decreased p.o. intake and yesterday at work he felt lightheaded and with fogginess. He also noted decreased urinary output and dark urine and drank Pedialyte with improved urinary output throughout the night however today again at work he noticed similar symptoms which prompted him to come to the ED. States that since getting IV fluids he has been feeling much better. Admits to drinking 12 beers daily with his most recent drink Tuesday night.  Patient was noted to have hyponatremia with sodium 115, hypokalemia 2.8.  COVID PCR negative.  Patient was started on IV fluids for hyponatremia and CIWA protocol for alcohol withdrawal.  Discharge Diagnoses:  Principal Problem:   Hyponatremia Active Problems:   Alcohol abuse   Alcohol withdrawal (HCC)   Hypomagnesemia   Hypokalemia   Elevated LFTs   Hypoosmolar hyponatremia -Likely due to poor p.o. intake, possible contributing from benazepril-HCTZ, question if Wellbutrin as well as alcoholism contributing to his hyponatremia.  His sodium level was as low as 128 back in 2018. -Improved   Alcohol abuse at risk of alcohol withdrawal -TOC provided alcohol cessation  counseling resources  Elevated LFTs -RUQ US shows hepatic steatosis -Improved  Hypertension -Continue Toprol, added Norvasc  Recent COVID-19 -Repeat Covid PCR negative.  Can and isolation precaution  Depression/anxiety -Continue Wellbutrin   Discharge Instructions  Discharge Instructions    Call MD for:  difficulty breathing, headache or visual disturbances   Complete by: As directed    Call MD for:  extreme fatigue   Complete by: As directed    Call MD for:  persistant dizziness or light-headedness   Complete by: As directed    Call MD for:  persistant nausea and vomiting   Complete by: As directed    Call MD for:  severe uncontrolled pain   Complete by: As directed    Call MD for:  temperature >100.4   Complete by: As directed    Discharge instructions   Complete by: As directed    You were cared for by a hospitalist during your hospital stay. If you have any questions about your discharge medications or the care you received while you were in the hospital after you are discharged, you can call the unit and ask to speak with the hospitalist on call if the hospitalist that took care of you is not available. Once you are discharged, your primary care physician will handle any further medical issues. Please note that NO REFILLS for any discharge medications will be authorized once you are discharged, as it is imperative that you return to your primary care physician (or establish a relationship with a primary care physician if you do not have one) for your aftercare needs so that they can reassess your need for medications and monitor your lab  values.   Increase activity slowly   Complete by: As directed      Allergies as of 01/17/2021   No Known Allergies     Medication List    STOP taking these medications   benazepril-hydrochlorthiazide 20-25 MG tablet Commonly known as: LOTENSIN HCT     TAKE these medications   ALPRAZolam 0.25 MG tablet Commonly known as:  XANAX Take 0.125 mg by mouth daily as needed for anxiety or sleep.   amLODipine 5 MG tablet Commonly known as: NORVASC Take 1 tablet (5 mg total) by mouth daily. Start taking on: January 18, 2021   betamethasone dipropionate 0.05 % cream Apply topically 2 (two) times daily as needed (Rash). What changed: how much to take   buPROPion 300 MG 24 hr tablet Commonly known as: WELLBUTRIN XL Take 300 mg by mouth daily.   chlordiazePOXIDE 5 MG capsule Commonly known as: LIBRIUM 2 tablets (10mg ) three times for 1 day, then 1 tablet (5mg ) three times for 1 day, then 1 tablet twice for 1 day, then 1 tablet once a day, then stop. For alcohol withdrawal treatment. Do not take with alcohol.   guaiFENesin 600 MG 12 hr tablet Commonly known as: MUCINEX Take 600 mg by mouth 2 (two) times daily.   ibuprofen 200 MG tablet Commonly known as: ADVIL Take 400 mg by mouth every 6 (six) hours as needed for mild pain.   metoprolol succinate 50 MG 24 hr tablet Commonly known as: TOPROL-XL Take 50 mg by mouth daily. Take with or immediately following a meal.   omeprazole 20 MG capsule Commonly known as: PRILOSEC Take 20 mg by mouth daily.   sildenafil 20 MG tablet Commonly known as: REVATIO Take 20 mg by mouth daily as needed.       Follow-up Information    Aura Dials, MD. Schedule an appointment as soon as possible for a visit in 1 week(s).   Specialty: Family Medicine Why: Repeat lab to check sodium level Contact information: [address] [place] [address] [phone]              No Known Allergies  Consultations:  None    Procedures/Studies: DG Chest Port 1 View  Result Date: 01/11/2021 CLINICAL DATA:  Shortness of breath COVID positive 214. EXAM: PORTABLE CHEST 1 VIEW COMPARISON:  Chest radiograph November 14, 2017 FINDINGS: The heart size and mediastinal contours are within normal limits. Both lungs are clear. The visualized skeletal structures are unremarkable.  IMPRESSION: No active disease. Electronically Signed   By: Dahlia Bailiff MD   On: 01/11/2021 12:05   US Abdomen Limited RUQ (LIVER/GB)  Result Date: 01/12/2021 CLINICAL DATA:  Abnormal liver function tests. EXAM: ULTRASOUND ABDOMEN LIMITED RIGHT UPPER QUADRANT COMPARISON:  July 30, 2017. FINDINGS: Gallbladder: No gallstones visualized. No sonographic Murphy sign noted by sonographer. Gallbladder appears to be contracted. No definite pericholecystic fluid is noted. Common bile duct: Diameter: 4 mm which is within normal limits. Liver: No focal lesion identified. Increased echogenicity of hepatic parenchyma is noted suggesting hepatic steatosis. Portal vein is patent on color Doppler imaging with normal direction of blood flow towards the liver. Other: None. IMPRESSION: Probable hepatic steatosis. No other definite abnormality seen in the right upper quadrant of the abdomen. Electronically Signed   By: Marijo Conception M.D.   On: 01/12/2021 09:02       Discharge Exam: Vitals:   01/17/21 1025 01/17/21 1209  BP: (!) 136/98 (!) 138/92  Pulse:  97 73  Resp:  (!) 22  Temp:  98.2 F (36.8 C)  SpO2:  97%   General exam: Appears calm and comfortable  Respiratory system: Clear to auscultation. Respiratory effort normal. Cardiovascular system: S1 & S2 heard, RRR. No pedal edema. Gastrointestinal system: Abdomen is nondistended, soft and nontender. Normal bowel sounds heard. Central nervous system: Alert and oriented. Non focal exam. Speech clear. +mild tremors  Extremities: Symmetric in appearance bilaterally  Skin: No rashes, lesions or ulcers on exposed skin  Psychiatry: Judgement and insight appear stable. Mood & affect appropriate.     The results of significant diagnostics from this hospitalization (including imaging, microbiology, ancillary and laboratory) are listed below for reference.     Microbiology: Recent Results (from the past 240 hour(s))  SARS Coronavirus 2 by RT PCR  (hospital order, performed in [place] hospital lab)     Status: None   Collection Time: 01/11/21  1:30 PM  Result Value Ref Range Status   SARS Coronavirus 2 NEGATIVE NEGATIVE Final    Comment: (NOTE) SARS-CoV-2 target nucleic acids are NOT DETECTED.  The SARS-CoV-2 RNA is generally detectable in upper and lower respiratory specimens during the acute phase of infection. The lowest concentration of SARS-CoV-2 viral copies this assay can detect is 250 copies / mL. A negative result does not preclude SARS-CoV-2 infection and should not be used as the sole basis for treatment or other patient management decisions.  A negative result may occur with improper specimen collection / handling, submission of specimen other than nasopharyngeal swab, presence of viral mutation(s) within the areas targeted by this assay, and inadequate number of viral copies (<250 copies / mL). A negative result must be combined with clinical observations, patient history, and epidemiological information.  Fact Sheet for Patients:   StrictlyIdeas.no  Fact Sheet for Healthcare Providers: BankingDealers.co.za  This test is not yet approved or  cleared by the [place] FDA and has been authorized for detection and/or diagnosis of SARS-CoV-2 by FDA under an Emergency Use Authorization (EUA).  This EUA will remain in effect (meaning this test can be used) for the duration of the COVID-19 declaration under Section 564(b)(1) of the Act, 21 U.S.C. section 360bbb-3(b)(1), unless the authorization is terminated or revoked sooner.  Performed at [facility], [address] [place], [place] [address]      Labs: BNP (last 3 results) No results for input(s): BNP in the last 8760 hours. Basic Metabolic Panel: Recent Labs  Lab 01/11/21 1153 01/11/21 1623 01/11/21 2003 01/13/21 0126 01/13/21 0713 01/14/21 0525 01/14/21 1037 01/15/21 0422  01/16/21 0408 01/17/21 0400  NA 115* 120*   < > 121*  122*   < > 129* 128* 128* 132* 131*  K 2.8* 3.2*   < > 3.8  3.5   < > 3.5 3.6 3.4* 3.4* 3.7  CL 75* 80*   < > 88*  89*   < > 95* 95* 94* 96* 96*  CO2 26 26   < > 24  23   < > 25 24 25 26 24   GLUCOSE 149* 154*   < > 97  100*   < > 104* 102* 94 107* 99  BUN <5* <5*   < > 7  7   < > 7 5* 6 <5* 6  CREATININE 0.41* 0.38*   < > 0.57*  0.51*   < > 0.57* 0.70 0.55* 0.59* 0.71  CALCIUM 9.0 8.9   < > 8.3*  8.5*   < > 8.3* 8.3* 8.3* 8.8* 8.7*  MG 1.3* 1.4*  --  2.0  --   --   --   --   --  2.2  PHOS  --  2.7  --   --   --   --   --   --   --   --    < > = values in this interval not displayed.   Liver Function Tests: Recent Labs  Lab 01/13/21 0126 01/14/21 0525 01/15/21 0422 01/16/21 0408 01/17/21 0400  AST 106* 81* 71* 62* 59*  ALT 79* 59* 53* 49* 43  ALKPHOS 81 78 76 78 83  BILITOT 4.5* 2.7* 2.7* 2.6* 1.9*  PROT 7.1 7.0 6.7 7.4 7.3  ALBUMIN 3.7 3.4* 3.2* 3.4* 3.4*   No results for input(s): LIPASE, AMYLASE in the last 168 hours. No results for input(s): AMMONIA in the last 168 hours. CBC: Recent Labs  Lab 01/11/21 1153 01/11/21 1623 01/12/21 0023  WBC 7.6 6.7 6.3  NEUTROABS 5.5  --   --   HGB 13.9 13.8 12.5*  HCT RESULTS UNAVAILABLE DUE TO INTERFERING SUBSTANCE 37.9* 34.6*  MCV RESULTS UNAVAILABLE DUE TO INTERFERING SUBSTANCE 94.8 97.2  PLT 168 169 162   Cardiac Enzymes: Recent Labs  Lab 01/11/21 1153  CKTOTAL 221   BNP: Invalid input(s): POCBNP CBG: No results for input(s): GLUCAP in the last 168 hours. D-Dimer No results for input(s): DDIMER in the last 72 hours. Hgb A1c No results for input(s): HGBA1C in the last 72 hours. Lipid Profile No results for input(s): CHOL, HDL, LDLCALC, TRIG, CHOLHDL, LDLDIRECT in the last 72 hours. Thyroid function studies No results for input(s): TSH, T4TOTAL, T3FREE, THYROIDAB in the last 72 hours.  Invalid input(s): FREET3 Anemia work up No results for input(s):  VITAMINB12, FOLATE, FERRITIN, TIBC, IRON, RETICCTPCT in the last 72 hours. Urinalysis    Component Value Date/Time   COLORURINE YELLOW 01/11/2021 1153   APPEARANCEUR CLEAR 01/11/2021 1153   LABSPEC 1.010 01/11/2021 1153   PHURINE 7.0 01/11/2021 1153   GLUCOSEU NEGATIVE 01/11/2021 1153   HGBUR NEGATIVE 01/11/2021 1153   BILIRUBINUR NEGATIVE 01/11/2021 1153   KETONESUR NEGATIVE 01/11/2021 1153   PROTEINUR NEGATIVE 01/11/2021 1153   NITRITE NEGATIVE 01/11/2021 1153   LEUKOCYTESUR NEGATIVE 01/11/2021 1153   Sepsis Labs Invalid input(s): PROCALCITONIN,  WBC,  LACTICIDVEN Microbiology Recent Results (from the past 240 hour(s))  SARS Coronavirus 2 by RT PCR (hospital order, performed in [place] hospital lab)     Status: None   Collection Time: 01/11/21  1:30 PM  Result Value Ref Range Status   SARS Coronavirus 2 NEGATIVE NEGATIVE Final    Comment: (NOTE) SARS-CoV-2 target nucleic acids are NOT DETECTED.  The SARS-CoV-2 RNA is generally detectable in upper and lower respiratory specimens during the acute phase of infection. The lowest concentration of SARS-CoV-2 viral copies this assay can detect is 250 copies / mL. A negative result does not preclude SARS-CoV-2 infection and should not be used as the sole basis for treatment or other patient management decisions.  A negative result may occur with improper specimen collection / handling, submission of specimen other than nasopharyngeal swab, presence of viral mutation(s) within the areas targeted by this assay, and inadequate number of viral copies (<250 copies / mL). A negative result must be combined with clinical observations, patient history, and epidemiological information.  Fact Sheet for Patients:   StrictlyIdeas.no  Fact Sheet for Healthcare Providers: BankingDealers.co.za  This  test is not yet approved or  cleared by the [place] and has been authorized for  detection and/or diagnosis of SARS-CoV-2 by FDA under an Emergency Use Authorization (EUA).  This EUA will remain in effect (meaning this test can be used) for the duration of the COVID-19 declaration under Section 564(b)(1) of the Act, 21 U.S.C. section 360bbb-3(b)(1), unless the authorization is terminated or revoked sooner.  Performed at [facility], [address] [place], [place] [address]      Patient was seen and examined on the day of discharge and was found to be in stable condition. Time coordinating discharge: 35 minutes including assessment and coordination of care, as well as examination of the patient.   SIGNED:  Dessa Phi, DO [facility] 01/17/2021, 3:44 PM

## 2021-01-18 ENCOUNTER — Telehealth: Payer: Self-pay | Admitting: Family Medicine

## 2021-01-18 MED ORDER — CHLORDIAZEPOXIDE HCL 5 MG PO CAPS: ORAL_CAPSULE | ORAL | 0 refills | Status: AC

## 2021-01-18 NOTE — Telephone Encounter (Signed)
Wife called that Librium not available at prescribed pharmacy.  Called to [facility] and canceled script.  Confirmed that it had not been filled yet.  Called to [facility] to confirm they had supply of Librium 5 mg tabs.  [facility]  Discussed with wife.

## 2022-05-30 IMAGING — DX DG CHEST 1V PORT
1 series · 1 of 1 positions shown · non-contrast
Comparison: Chest radiograph November 14, 2017

CLINICAL DATA: Shortness of breath COVID positive 214.

EXAM:
PORTABLE CHEST 1 VIEW

[chest ap]
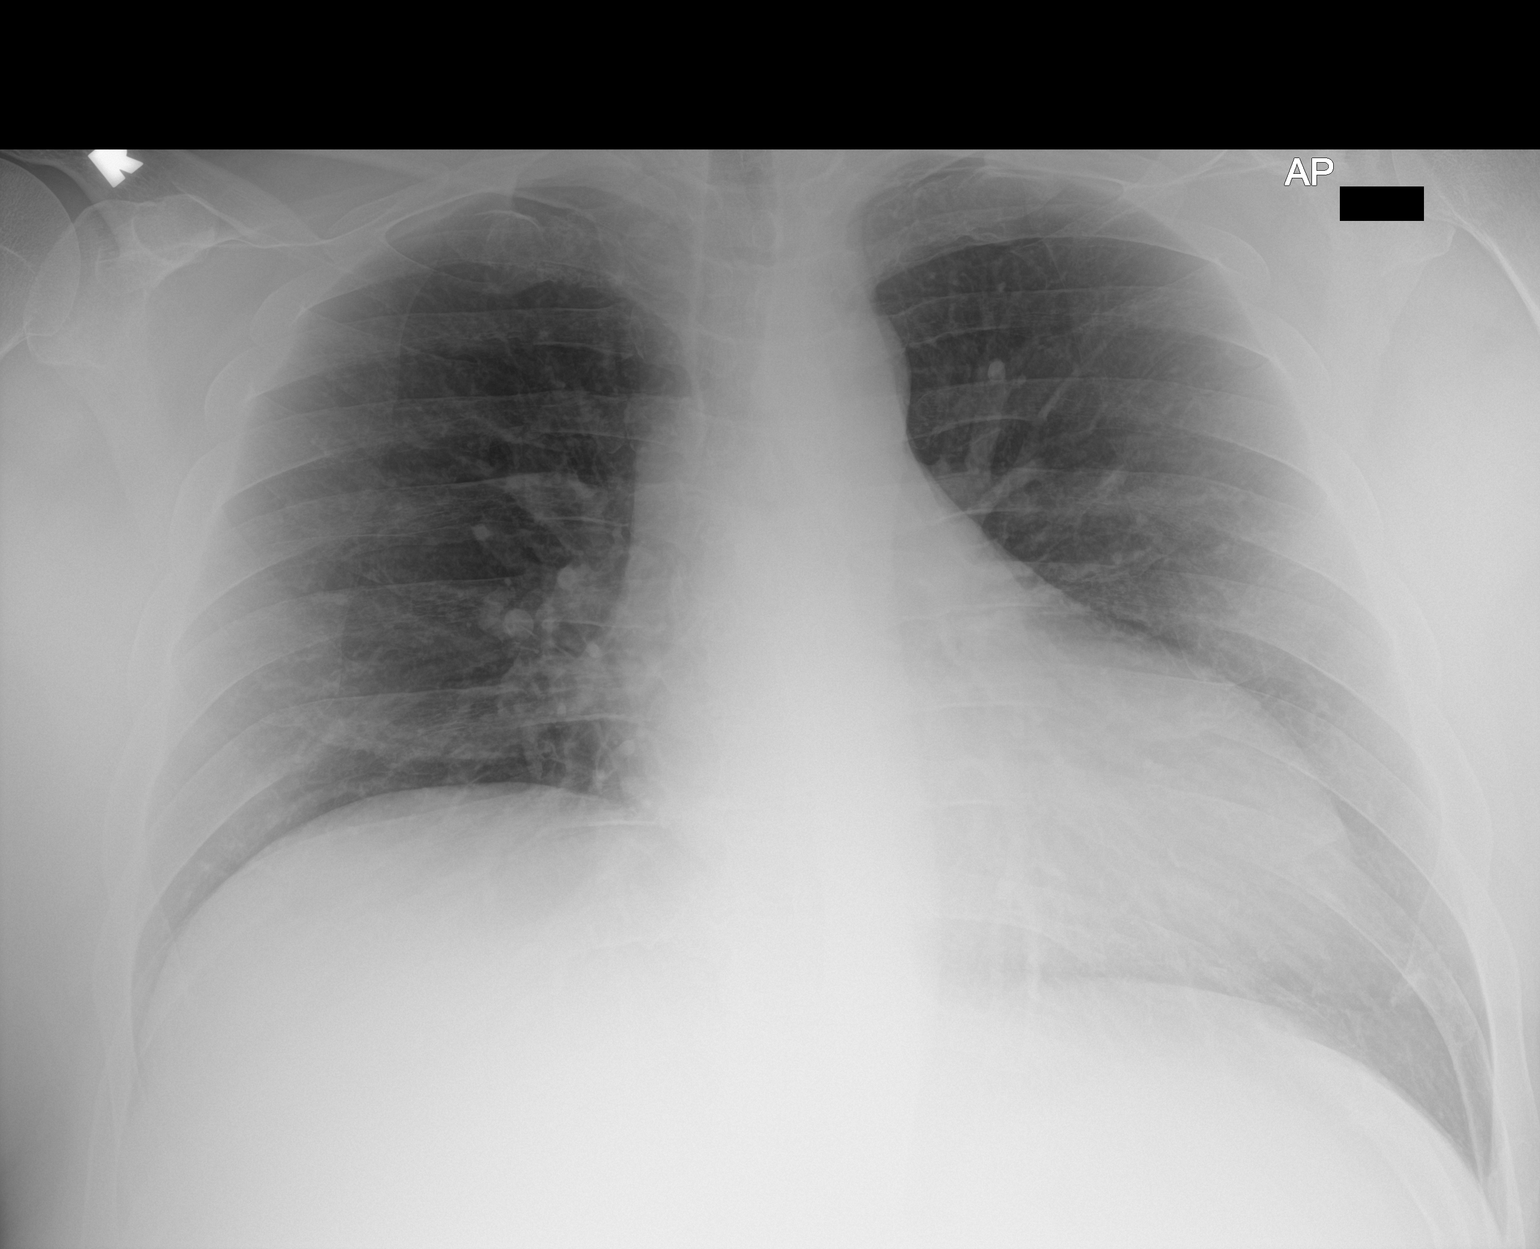

[1 of 1 positions shown; findings below may reference images not displayed]

FINDINGS: The heart size and mediastinal contours are within normal limits.
Both lungs are clear. The visualized skeletal structures are
unremarkable.
IMPRESSION: No active disease.

## 2022-05-31 IMAGING — US US ABDOMEN LIMITED RUQ/ASCITES
1 series · 14 of 25 positions shown · non-contrast
Comparison: July 30, 2017.

CLINICAL DATA: Abnormal liver function tests.

EXAM:
ULTRASOUND ABDOMEN LIMITED RIGHT UPPER QUADRANT

[Series 1: us abdomen limited ruq/ascites · 14 of 35 slices shown]
[im 1/35]
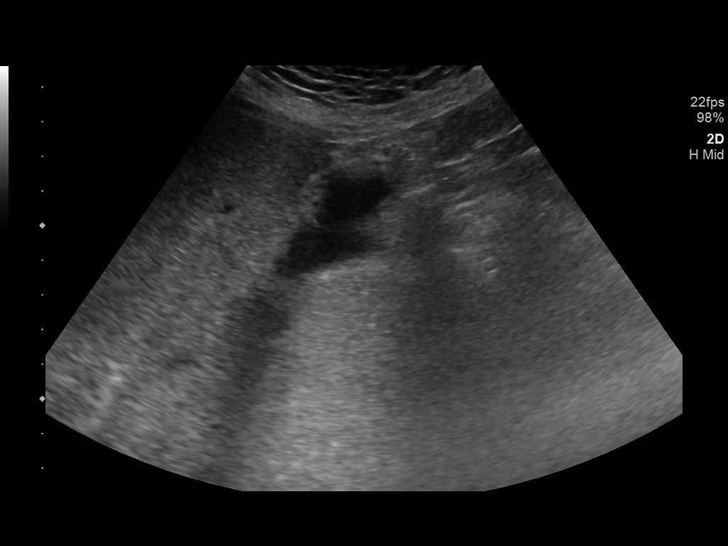
[im 3/35]
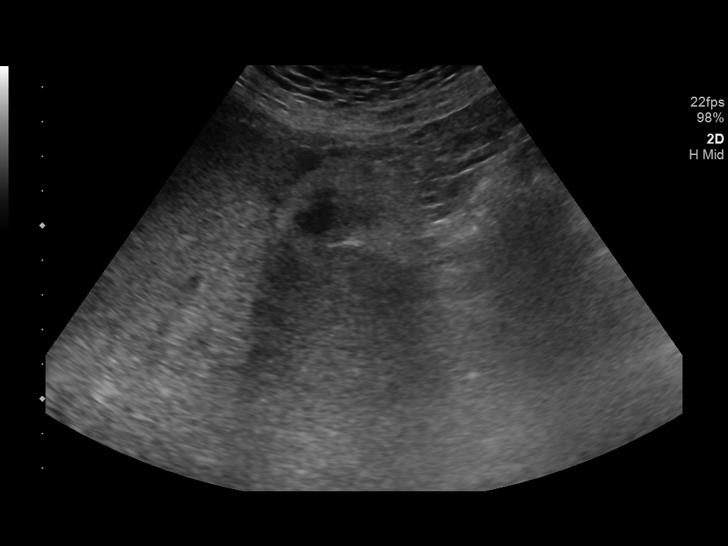
[im 6/35]
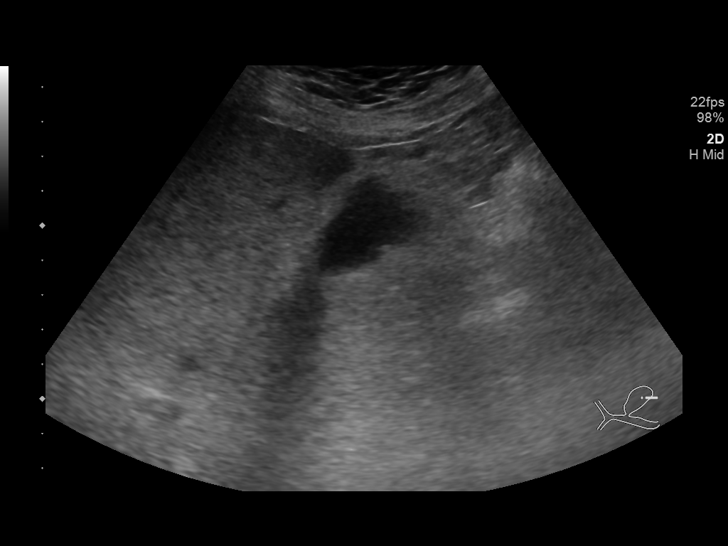
[im 9/35]
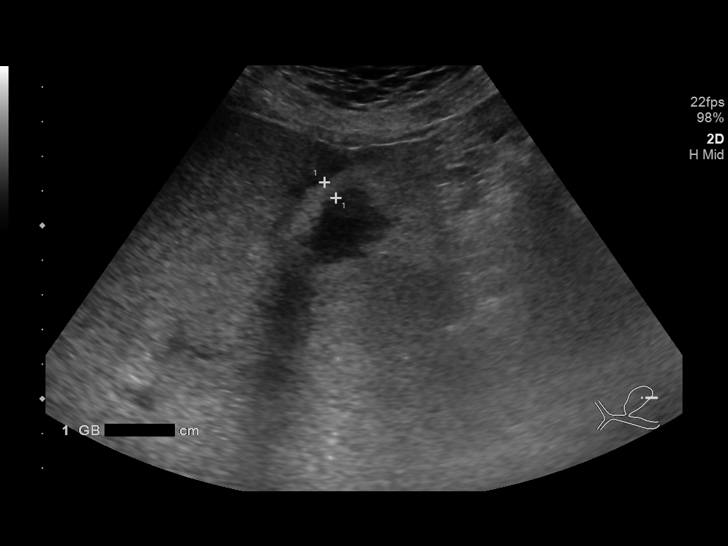
[im 12/35]
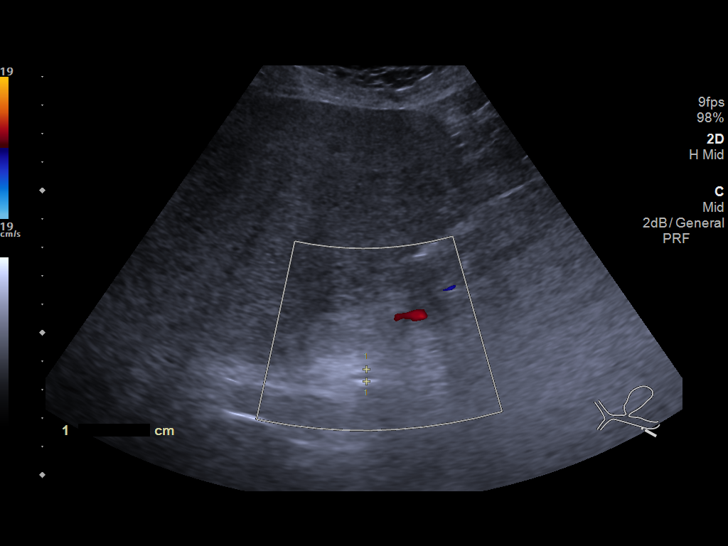
[im 13/35]
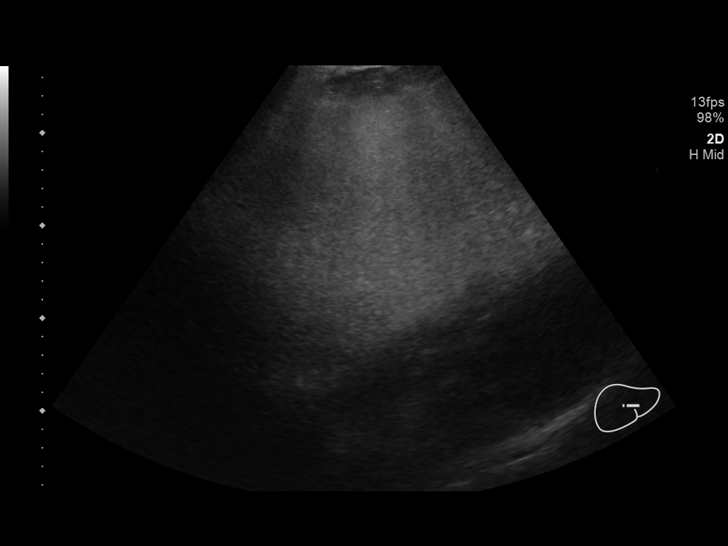
[im 16/35]
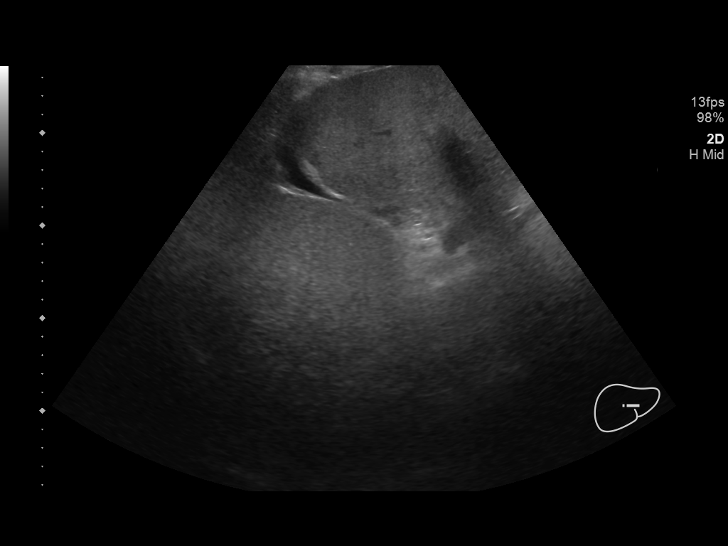
[im 19/35]
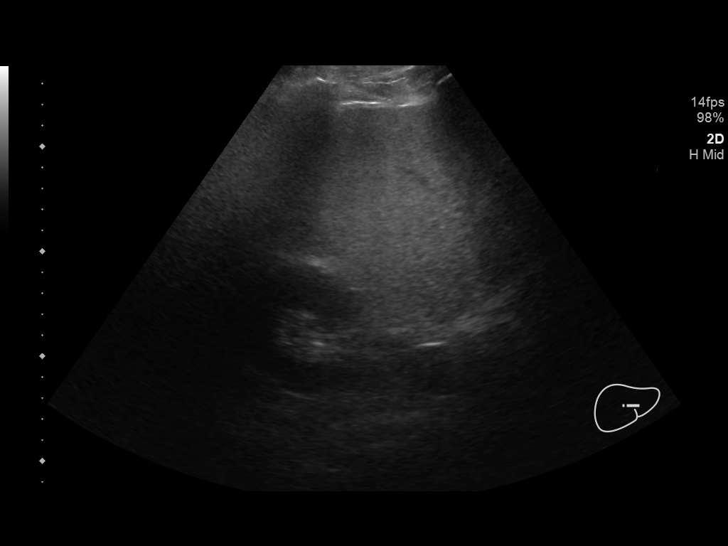
[im 22/35]
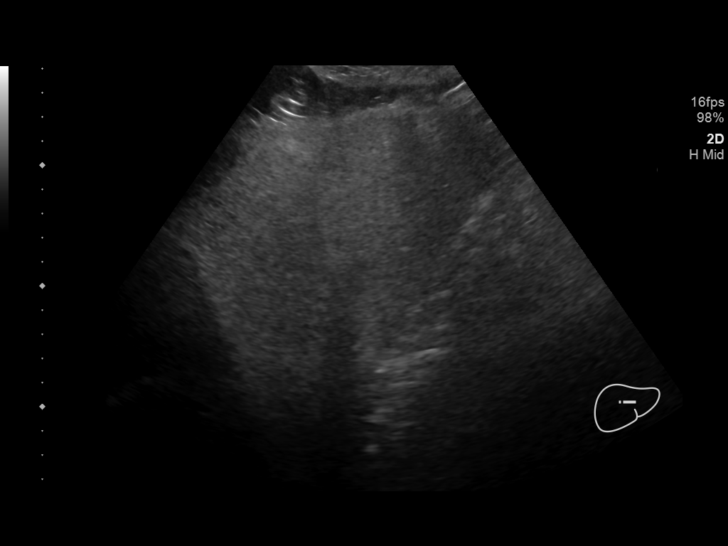
[im 23/35]
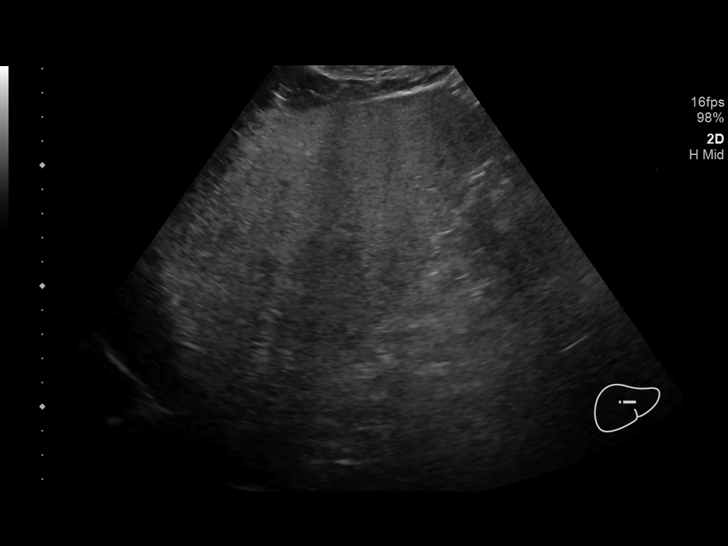
[im 26/35]
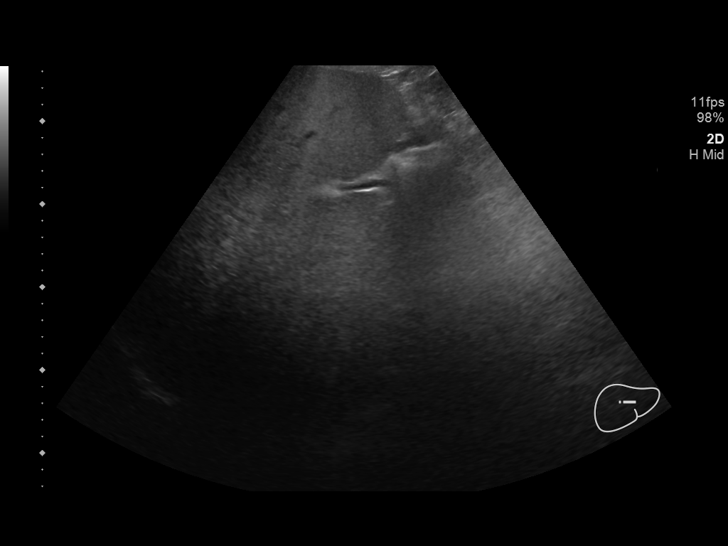
[im 29/35]
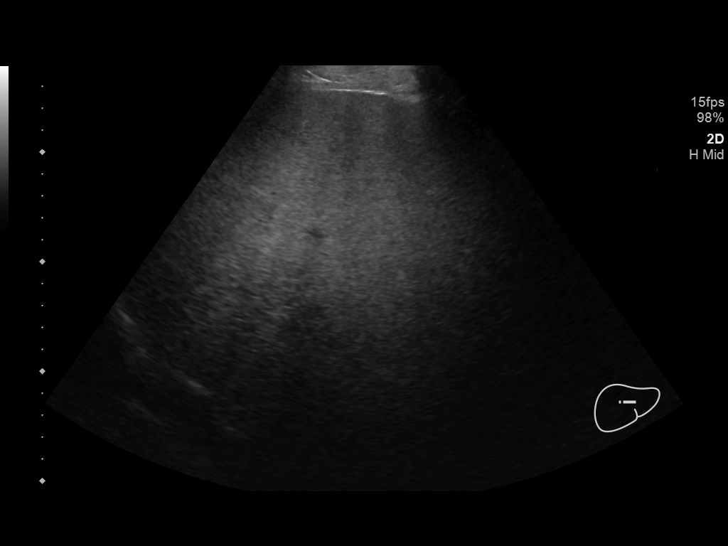
[im 32/35]
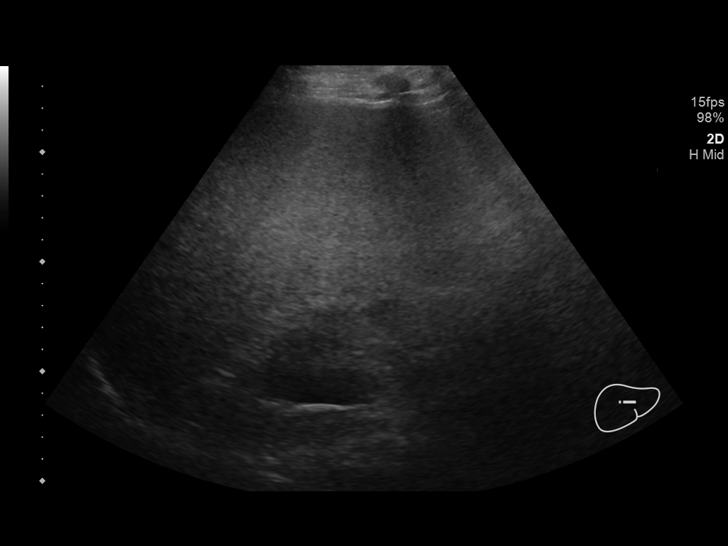
[im 35/35]
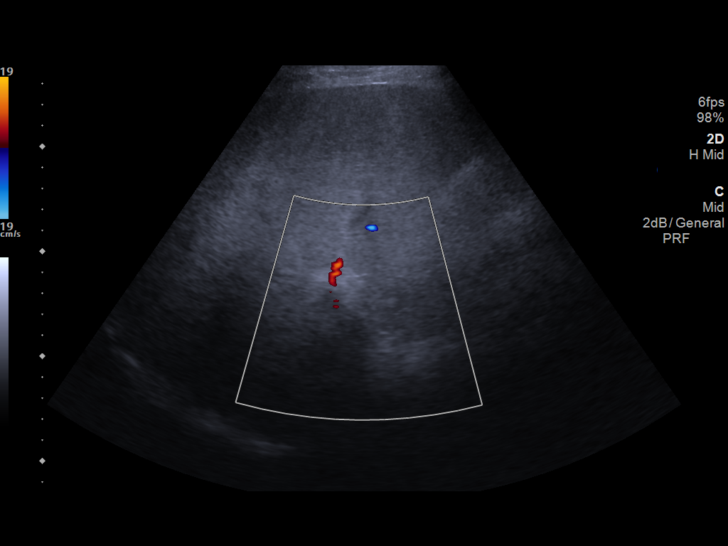

[14 of 25 positions shown; findings below may reference images not displayed]

FINDINGS: Gallbladder:

No gallstones visualized. No sonographic Murphy sign noted by
sonographer. Gallbladder appears to be contracted. No definite
pericholecystic fluid is noted.

Common bile duct:

Diameter: 4 mm which is within normal limits.

Liver:

No focal lesion identified. Increased echogenicity of hepatic
parenchyma is noted suggesting hepatic steatosis. Portal vein is
patent on color Doppler imaging with normal direction of blood flow
towards the liver.

Other: None.
IMPRESSION: Probable hepatic steatosis. No other definite abnormality seen in
the right upper quadrant of the abdomen.

## 2024-09-22 ENCOUNTER — Other Ambulatory Visit: Payer: Self-pay

## 2024-09-22 ENCOUNTER — Emergency Department (HOSPITAL_BASED_OUTPATIENT_CLINIC_OR_DEPARTMENT_OTHER)

## 2024-09-22 ENCOUNTER — Encounter (HOSPITAL_BASED_OUTPATIENT_CLINIC_OR_DEPARTMENT_OTHER): Payer: Self-pay | Admitting: Emergency Medicine

## 2024-09-22 ENCOUNTER — Emergency Department (HOSPITAL_BASED_OUTPATIENT_CLINIC_OR_DEPARTMENT_OTHER)
Admission: EM | Admit: 2024-09-22 | Discharge: 2024-09-22 | Disposition: A | Discharge status: Home / Self Care | Attending: Emergency Medicine | Admitting: Emergency Medicine

## 2024-09-22 DIAGNOSIS — N50811 Right testicular pain: Secondary | ICD-10-CM | POA: Insufficient documentation

## 2024-09-22 DIAGNOSIS — Z79899 Other long term (current) drug therapy: Secondary | ICD-10-CM | POA: Insufficient documentation

## 2024-09-22 DIAGNOSIS — R410 Disorientation, unspecified: Secondary | ICD-10-CM

## 2024-09-22 DIAGNOSIS — R1030 Lower abdominal pain, unspecified: Secondary | ICD-10-CM | POA: Diagnosis present

## 2024-09-22 DIAGNOSIS — K4090 Unilateral inguinal hernia, without obstruction or gangrene, not specified as recurrent: Secondary | ICD-10-CM | POA: Insufficient documentation

## 2024-09-22 DIAGNOSIS — R7401 Elevation of levels of liver transaminase levels: Secondary | ICD-10-CM | POA: Insufficient documentation

## 2024-09-22 DIAGNOSIS — R748 Abnormal levels of other serum enzymes: Secondary | ICD-10-CM

## 2024-09-22 DIAGNOSIS — R4182 Altered mental status, unspecified: Secondary | ICD-10-CM | POA: Insufficient documentation

## 2024-09-22 DIAGNOSIS — I10 Essential (primary) hypertension: Secondary | ICD-10-CM | POA: Diagnosis not present

## 2024-09-22 LAB — CBC
HCT: 45.8 % (ref 39.0–52.0)
Hemoglobin: 16 g/dL (ref 13.0–17.0)
MCH: 30.9 pg (ref 26.0–34.0)
MCHC: 34.9 g/dL (ref 30.0–36.0)
MCV: 88.4 fL (ref 80.0–100.0)
Platelets: 289 K/uL (ref 150–400)
RBC: 5.18 MIL/uL (ref 4.22–5.81)
RDW: 12.2 % (ref 11.5–15.5)
WBC: 8.9 K/uL (ref 4.0–10.5)
nRBC: 0 % (ref 0.0–0.2)

## 2024-09-22 LAB — URINALYSIS, ROUTINE W REFLEX MICROSCOPIC
Bilirubin Urine: NEGATIVE
Glucose, UA: NEGATIVE mg/dL
Hgb urine dipstick: NEGATIVE
Ketones, ur: NEGATIVE mg/dL
Leukocytes,Ua: NEGATIVE
Nitrite: NEGATIVE
Protein, ur: NEGATIVE mg/dL
Specific Gravity, Urine: 1.01 (ref 1.005–1.030)
pH: 7 (ref 5.0–8.0)

## 2024-09-22 LAB — COMPREHENSIVE METABOLIC PANEL WITH GFR
ALT: 58 U/L — ABNORMAL HIGH (ref 0–44)
AST: 33 U/L (ref 15–41)
Albumin: 4.7 g/dL (ref 3.5–5.0)
Alkaline Phosphatase: 76 U/L (ref 38–126)
Anion gap: 12 (ref 5–15)
BUN: 10 mg/dL (ref 6–20)
CO2: 24 mmol/L (ref 22–32)
Calcium: 9.3 mg/dL (ref 8.9–10.3)
Chloride: 102 mmol/L (ref 98–111)
Creatinine, Ser: 0.9 mg/dL (ref 0.61–1.24)
GFR, Estimated: >60 mL/min (ref >60)
Glucose, Bld: 110 mg/dL — ABNORMAL HIGH (ref 70–99)
Potassium: 4 mmol/L (ref 3.5–5.1)
Sodium: 137 mmol/L (ref 135–145)
Total Bilirubin: 0.7 mg/dL (ref 0.0–1.2)
Total Protein: 7.5 g/dL (ref 6.5–8.1)

## 2024-09-22 LAB — URINE DRUG SCREEN
Amphetamines: NEGATIVE
Barbiturates: NEGATIVE
Benzodiazepines: NEGATIVE
Cocaine: NEGATIVE
Fentanyl: NEGATIVE
Methadone Scn, Ur: NEGATIVE
Opiates: NEGATIVE
Tetrahydrocannabinol: NEGATIVE

## 2024-09-22 LAB — CBG MONITORING, ED: Glucose-Capillary: 135 mg/dL — ABNORMAL HIGH (ref 70–99)

## 2024-09-22 LAB — ETHANOL: Alcohol, Ethyl (B): <15 mg/dL (ref <15)

## 2024-09-22 LAB — AMMONIA: Ammonia: 40 umol/L — ABNORMAL HIGH (ref 9–35)

## 2024-09-22 LAB — LIPASE, BLOOD: Lipase: 24 U/L (ref 11–51)

## 2024-09-22 LAB — OCCULT BLOOD X 1 CARD TO LAB, STOOL: Fecal Occult Bld: NEGATIVE

## 2024-09-22 NOTE — ED Triage Notes (Signed)
 Pt c/o urinary frequency today, R groin pain.   Also reports some confusion today, spouse reports he was unable to remember home address. Spouse also reports pt had a tremor over the weekend, no tremor noted in triage.    Hx of Na/K abnormalities.   Aox4, answers questions appropriately.

## 2024-09-22 NOTE — Discharge Instructions (Signed)
 You were seen today for right gluteal hernia as well as notably had a mildly elevated liver enzyme with having had 1 episode of mild confusion.  Your lab work, your physical exam and imaging today were very reassuring the low suspicion for any emergent cause recent today.  However did note that you did have a right inguinal hernia present today that was fat-containing.  This is likely cause of the discomfort you were experiencing earlier today.  Recommend he continue to follow-up with general surgery for management of this problem.  Also recommend continued follow-up with your PCP for further management of the elevated liver enzyme.  Return to ER if contaminated new or worsening symptoms which include worsening confusion, unilateral weakness, persistent vomiting, uncontrollable headache, fever, chest pain, shortness of breath, uncontrollable pain.

## 2024-09-22 NOTE — ED Provider Notes (Signed)
 [facility] Provider Note   CSN: [id number] Arrival date & time: 09/22/24  1305     Patient presents with: Groin Pain, Urinary Frequency, and Altered Mental Status   Danny Richard is a 53 y.o. male.   Groin Pain  Urinary Frequency  Altered Mental Status  Patient is a 53 year old male here for multiple complaints, coming into the emergency department for concerns for not feeling right stating that he has been under any mental health stress at work for the last 6 months and today forgot his address momentarily as well as noting that he has had 3 days of intermittent hand tremors, diarrhea which he notes was watery x 2 days but yesterday noted 1 episode of black stools.  He is concerned for electrolyte imbalance as he says he felt similarly when he had this in the past with alcohol abuse.  Notably had also endorsed having some right testicular pain that was present for approximately 1 hour before arriving to the ED, currently asymptomatic.  Endorsing urinary urgency.  Previous medical history of HTN, anxiety, HLD, alcohol abuse/withdrawal.  Reports that he has not had any alcohol since 2022.  Denies fever, headache, vision changes, unilateral weakness, chest pain, shortness of breath, abdominal pain, nausea, vomiting, hematochezia, dysuria, rashes, lower leg swelling.      Prior to Admission medications   Medication Sig Start Date End Date Taking? Authorizing Provider  ALPRAZolam (XANAX) 0.25 MG tablet Take 0.125 mg by mouth daily as needed for anxiety or sleep. 06/18/16   [provider]  amLODipine  (NORVASC ) 5 MG tablet Take 1 tablet (5 mg total) by mouth daily. 01/18/21   Rojelio Nest, DO  betamethasone  dipropionate 0.05 % cream Apply topically 2 (two) times daily as needed (Rash). Patient taking differently: Apply 1 application topically 2 (two) times daily as needed (Rash). 03/10/20   Clark-Burning, Nest, PA-C   buPROPion  (WELLBUTRIN  XL) 300 MG 24 hr tablet Take 300 mg by mouth daily.    [provider]  chlordiazePOXIDE  (LIBRIUM ) 5 MG capsule 2 tablets (10mg ) three times for 1 day, then 1 tablet (5mg ) three times for 1 day, then 1 tablet twice for 1 day, then 1 tablet once a day, then stop. For alcohol withdrawal treatment. Do not take with alcohol. 01/18/21   Danford, Lonni SQUIBB, MD  guaiFENesin (MUCINEX) 600 MG 12 hr tablet Take 600 mg by mouth 2 (two) times daily.    [provider]  ibuprofen (ADVIL) 200 MG tablet Take 400 mg by mouth every 6 (six) hours as needed for mild pain.    [provider]  metoprolol  succinate (TOPROL -XL) 50 MG 24 hr tablet Take 50 mg by mouth daily. Take with or immediately following a meal.    [provider]  omeprazole (PRILOSEC) 20 MG capsule Take 20 mg by mouth daily.    [provider]  sildenafil (REVATIO) 20 MG tablet Take 20 mg by mouth daily as needed.    [provider]    Allergies: Patient has no known allergies.    Review of Systems  Genitourinary:  Positive for frequency.    Updated Vital Signs BP 126/71   Pulse 64   Temp 97.6 F (36.4 C) (Tympanic)   Resp 14   Ht 6' (1.829 m)   Wt 117.9 kg   SpO2 100%   BMI 35.26 kg/m   Physical Exam Vitals and nursing note reviewed. Exam conducted with a chaperone present.  Constitutional:  General: He is not in acute distress.    Appearance: Normal appearance. He is not ill-appearing or diaphoretic.  HENT:     Head: Normocephalic and atraumatic.  Eyes:     General: No scleral icterus.       Right eye: No discharge.        Left eye: No discharge.     Extraocular Movements: Extraocular movements intact.     Conjunctiva/sclera: Conjunctivae normal.     Pupils: Pupils are equal, round, and reactive to light.  Cardiovascular:     Rate and Rhythm: Normal rate and regular rhythm.     Pulses: Normal pulses.     Heart sounds: Normal heart sounds.  No murmur heard.    No friction rub. No gallop.  Pulmonary:     Effort: Pulmonary effort is normal. No respiratory distress.     Breath sounds: No stridor. No wheezing, rhonchi or rales.  Chest:     Chest wall: No tenderness.  Abdominal:     General: Abdomen is flat. There is no distension.     Palpations: Abdomen is soft.     Tenderness: There is no abdominal tenderness. There is no right CVA tenderness, left CVA tenderness, guarding or rebound.     Hernia: A hernia is present. Hernia is present in the right inguinal area. There is no hernia in the left inguinal area.  Genitourinary:    Penis: Normal.      Testes:        Right: Tenderness present. Testicular hydrocele not present.        Left: Tenderness, swelling or testicular hydrocele not present. Left testis is descended.     Epididymis:     Right: No tenderness.     Left: Normal.  Musculoskeletal:        General: No swelling, deformity or signs of injury.     Cervical back: Normal range of motion. No rigidity.     Right lower leg: No edema.     Left lower leg: No edema.  Skin:    General: Skin is warm and dry.     Findings: No bruising, erythema or lesion.  Neurological:     General: No focal deficit present.     Mental Status: He is alert and oriented to person, place, and time. Mental status is at baseline.     Sensory: No sensory deficit.     Motor: No weakness.     Coordination: Coordination normal.     Comments: No facial asymmetry, no ataxia, no apraxia, no aphasia, no arm drift, normal coordination with finger-to-nose, normal sensation to both upper and lower extremities bilaterally, normal grip strength bilaterally, normal strength to both flexion and extension to both upper lower extremities 5+ bilaterally, no visual field deficits, no nystagmus.   Psychiatric:        Mood and Affect: Mood normal.     (all labs ordered are listed, but only abnormal results are displayed) Labs Reviewed  COMPREHENSIVE METABOLIC  PANEL WITH GFR - Abnormal; Notable for the following components:      Result Value   Glucose, Bld 110 (*)    ALT 58 (*)    All other components within normal limits  AMMONIA - Abnormal; Notable for the following components:   Ammonia 40 (*)    All other components within normal limits  CBG MONITORING, ED - Abnormal; Notable for the following components:   Glucose-Capillary 135 (*)    All other components within normal limits  CBC  URINALYSIS, ROUTINE W REFLEX MICROSCOPIC  ETHANOL  URINE DRUG SCREEN  OCCULT BLOOD X 1 CARD TO LAB, STOOL  LIPASE, BLOOD    EKG: EKG Interpretation Date/Time:  Tuesday September 22 2024 13:31:11 EST Ventricular Rate:  66 PR Interval:  196 QRS Duration:  115 QT Interval:  389 QTC Calculation: 408 R Axis:   -37  Text Interpretation: Sinus rhythm Incomplete RBBB and LAFB Confirmed by Darra Chew [phone]) on 09/22/2024 1:32:30 PM  Radiology: US  SCROTUM W/DOPPLER Result Date: 09/22/2024 EXAM: ULTRASOUND SCROTUM/TESTICLES WITH DOPPLER FLOW EVALUATION 09/22/2024 03:38:00 PM TECHNIQUE: Duplex ultrasound using B-mode/gray scaled imaging, Doppler spectral analysis and color flow Doppler was obtained of the testicles. COMPARISON: None available. CLINICAL HISTORY: Right testicular pain. FINDINGS: RIGHT: MEASUREMENTS: Right testicle measures 4.1 x 2.1 x 2.2 cm. GREY SCALE: The right testicle demonstrates normal homogeneous echotexture without focal lesion. No testicular microlithiasis. DOPPLER EVALUATION: There is normal arterial and venous Doppler flow within the testicle. VARICOCELE: No scrotal varicocele. SCROTAL SAC: No hydrocele. There is a fat-containing right inguinal hernia. EPIDIDYMIS: No acute abnormality. LEFT: MEASUREMENTS: Left testicle measures 3.4 x 1.5 x 2.6 cm. GREY SCALE: The left testicle demonstrates normal homogeneous echotexture without focal lesion. No testicular microlithiasis. DOPPLER EVALUATION: There is normal arterial and venous Doppler flow  within the testicle. VARICOCELE: No scrotal varicocele. SCROTAL SAC: No hydrocele. EPIDIDYMIS: There is some punctate calcification in the left epididymal tail. Head is within normal limits. IMPRESSION: 1. No evidence of torsion or other acute abnormality. 2. Fat-containing right inguinal hernia. Electronically signed by: Greig Pique MD 09/22/2024 04:17 PM EST RP Workstation: [id number]    Procedures   Medications Ordered in the ED - No data to display     Medical Decision Making Amount and/or Complexity of Data Reviewed Labs: ordered. Radiology: ordered.   This patient is a 53 year old male who presents to the ED for concern of multiple complaints including right testicular pain, urinary frequency, and 1 episode of forgetting his address which was classified by patient as confusion.  Notably has not had any confusion since.  Pain has since abated.  Denies substance abuse/alcohol use since 2022.  Noted that he also had an episode of black stool yesterday.  On physical exam, patient is in no acute distress, afebrile, alert and orient x 4, speaking in full sentences, nontachypneic, nontachycardic.  Abdomen is nontender, neuroexam is unremarkable.  LCTAB, RRR, no murmur.  No lower leg edema.  Notably did have some mild tenderness in the inguinal canal on right side on examination and did have what appeared to feel like a small inguinal hernia.  Exam was otherwise unremarkable.  Fecal occult negative.  Lab work was unremarkable.  Ultrasound did note right inguinal hernia.  With patient's 1 episode of forgetting his address, do not believe warranted as being classified as confusion.  He is currently alert and oriented x 4 and has had no recurrence of symptoms.  Noting that his pain is also resolved with hernia only being fat-containing and does not contain any palpable time.  Will refer him to general surgery to have this further managed by them.  As well as we will have him continue to follow-up  with PCP for liver abnormalities noted today with a mildly elevated liver enzyme as well as for the mildly elevated ammonia.  Low suspicion for any emergent causes of patient's today.  Suspect likely also having high levels of stress both at work and at home contributing to patient's current state.  Patient vital signs have remained stable throughout the course of patient's time in the ED. Low suspicion for any other emergent pathology at this time. I believe this patient is safe to be discharged. Provided strict return to ER precautions. Patient expressed agreement and understanding of plan. All questions were answered.  Differential diagnoses prior to evaluation: The emergent differential diagnosis includes, but is not limited to, electrolyte disturbance, encephalopathy, stroke, CVA,. This is not an exhaustive differential.   Past Medical History / Co-morbidities / Social History: HTN, HLD, anxiety, alcohol withdrawal  Additional history: Chart reviewed. Pertinent results include:   Last seen by orthopedics on 04/02/2023 complaining of left heel pain.  Diagnosed with plantar fasciitis.  Lab Tests/Imaging studies: I personally interpreted labs/imaging and the pertinent results include:    CBC unremarkable CMP notes a mild elevated ALT of 58 but otherwise unremarkable Lipase unremarkable UA unremarkable Ammonia mildly elevated at 40 but otherwise unremarkable Ethanol unremarkable UDS unremarkable Fecal occult negative Scrotal ultrasound shows right inguinal hernia but otherwise unremarkable..   I agree with the radiologist interpretation.  Cardiac monitoring: EKG obtained and interpreted by myself and attending physician which shows: Sinus rhythm with an incomplete right bundle branch block   EKG Interpretation Date/Time:  Tuesday September 22 2024 13:31:11 EST Ventricular Rate:  66 PR Interval:  196 QRS Duration:  115 QT Interval:  389 QTC Calculation: 408 R  Axis:   -37  Text Interpretation: Sinus rhythm Incomplete RBBB and LAFB Confirmed by Darra Chew [phone]) on 09/22/2024 1:32:30 PM          Medications:   I have reviewed the patients home medicines and have made adjustments as needed.  Critical Interventions: None  Social Determinants of Health: Has good follow-up with PCP  Disposition: After consideration of the diagnostic results and the patients response to treatment, I feel that the patient would benefit from discharge and treatment as above.   emergency department workup does not suggest an emergent condition requiring admission or immediate intervention beyond what has been performed at this time. The plan is: Follow-up with PCP, follow-up with general surgery, return to the ER for new or worsening symptoms. The patient is safe for discharge and has been instructed to return immediately for worsening symptoms, change in symptoms or any other concerns.   Final diagnoses:  Right inguinal hernia  Episode of confusion  Elevated liver enzymes    ED Discharge Orders     None          Beola Terrall GORMAN DEVONNA 09/22/24 1720    Doretha Folks, MD 09/25/24 [phone]
